# Patient Record
Sex: Female | Born: 1960 | Race: White | Hispanic: No | Smoking: Never smoker
Health system: Southern US, Community
[De-identification: ages and names within clinical notes are randomized; demographics above are authoritative.]

## PROBLEM LIST (undated history)

## (undated) DIAGNOSIS — K56609 Unspecified intestinal obstruction, unspecified as to partial versus complete obstruction: Secondary | ICD-10-CM

## (undated) HISTORY — PX: ABDOMINAL SURGERY: SHX537

---

## 2015-03-23 ENCOUNTER — Emergency Department (HOSPITAL_BASED_OUTPATIENT_CLINIC_OR_DEPARTMENT_OTHER): Payer: BLUE CROSS/BLUE SHIELD

## 2015-03-23 ENCOUNTER — Encounter (HOSPITAL_BASED_OUTPATIENT_CLINIC_OR_DEPARTMENT_OTHER): Payer: Self-pay | Admitting: *Deleted

## 2015-03-23 ENCOUNTER — Inpatient Hospital Stay (HOSPITAL_BASED_OUTPATIENT_CLINIC_OR_DEPARTMENT_OTHER)
Admission: EM | Admit: 2015-03-23 | Discharge: 2015-03-27 | DRG: 390 | Disposition: A | Discharge status: Home / Self Care | Payer: BLUE CROSS/BLUE SHIELD | Attending: General Surgery | Admitting: General Surgery

## 2015-03-23 DIAGNOSIS — K56600 Partial intestinal obstruction, unspecified as to cause: Secondary | ICD-10-CM

## 2015-03-23 DIAGNOSIS — R109 Unspecified abdominal pain: Secondary | ICD-10-CM | POA: Diagnosis present

## 2015-03-23 DIAGNOSIS — E039 Hypothyroidism, unspecified: Secondary | ICD-10-CM | POA: Diagnosis present

## 2015-03-23 DIAGNOSIS — Z79899 Other long term (current) drug therapy: Secondary | ICD-10-CM | POA: Diagnosis not present

## 2015-03-23 DIAGNOSIS — G43909 Migraine, unspecified, not intractable, without status migrainosus: Secondary | ICD-10-CM | POA: Diagnosis present

## 2015-03-23 DIAGNOSIS — K56609 Unspecified intestinal obstruction, unspecified as to partial versus complete obstruction: Secondary | ICD-10-CM | POA: Diagnosis present

## 2015-03-23 HISTORY — DX: Unspecified intestinal obstruction, unspecified as to partial versus complete obstruction: K56.609

## 2015-03-23 LAB — URINE MICROSCOPIC-ADD ON: RBC / HPF: NONE SEEN RBC/hpf (ref 0–5)

## 2015-03-23 LAB — CBC WITH DIFFERENTIAL/PLATELET
BASOS PCT: 0 %
Basophils Absolute: 0 10*3/uL (ref 0.0–0.1)
Eosinophils Absolute: 0 10*3/uL (ref 0.0–0.7)
Eosinophils Relative: 0 %
HEMATOCRIT: 42.1 % (ref 36.0–46.0)
HEMOGLOBIN: 14.8 g/dL (ref 12.0–15.0)
LYMPHS ABS: 1.5 10*3/uL (ref 0.7–4.0)
LYMPHS PCT: 15 %
MCH: 32.7 pg (ref 26.0–34.0)
MCHC: 35.2 g/dL (ref 30.0–36.0)
MCV: 93.1 fL (ref 78.0–100.0)
MONO ABS: 1 10*3/uL (ref 0.1–1.0)
MONOS PCT: 10 %
NEUTROS ABS: 7.3 10*3/uL (ref 1.7–7.7)
NEUTROS PCT: 75 %
Platelets: 239 10*3/uL (ref 150–400)
RBC: 4.52 MIL/uL (ref 3.87–5.11)
RDW: 12.3 % (ref 11.5–15.5)
WBC: 9.8 10*3/uL (ref 4.0–10.5)

## 2015-03-23 LAB — URINALYSIS, ROUTINE W REFLEX MICROSCOPIC
Bilirubin Urine: NEGATIVE
GLUCOSE, UA: NEGATIVE mg/dL
Hgb urine dipstick: NEGATIVE
KETONES UR: NEGATIVE mg/dL
NITRITE: NEGATIVE
PH: 7.5 (ref 5.0–8.0)
Protein, ur: NEGATIVE mg/dL
SPECIFIC GRAVITY, URINE: 1.021 (ref 1.005–1.030)

## 2015-03-23 LAB — COMPREHENSIVE METABOLIC PANEL
ALBUMIN: 4.4 g/dL (ref 3.5–5.0)
ALK PHOS: 67 U/L (ref 38–126)
ALT: 38 U/L (ref 14–54)
ANION GAP: 13 (ref 5–15)
AST: 29 U/L (ref 15–41)
BILIRUBIN TOTAL: 0.8 mg/dL (ref 0.3–1.2)
BUN: 17 mg/dL (ref 6–20)
CALCIUM: 9.6 mg/dL (ref 8.9–10.3)
CO2: 26 mmol/L (ref 22–32)
Chloride: 100 mmol/L — ABNORMAL LOW (ref 101–111)
Creatinine, Ser: 0.66 mg/dL (ref 0.44–1.00)
GFR calc Af Amer: >60 mL/min (ref >60)
GLUCOSE: 128 mg/dL — AB (ref 65–99)
POTASSIUM: 3.5 mmol/L (ref 3.5–5.1)
Sodium: 139 mmol/L (ref 135–145)
TOTAL PROTEIN: 8 g/dL (ref 6.5–8.1)

## 2015-03-23 LAB — LIPASE, BLOOD: LIPASE: 25 U/L (ref 11–51)

## 2015-03-23 MED ORDER — ONDANSETRON HCL 4 MG/2ML IJ SOLN
4.0000 mg | Freq: Once | INTRAMUSCULAR | Status: AC
  Administered 2015-03-23: 4 mg via INTRAVENOUS
  Filled 2015-03-23: qty 2

## 2015-03-23 MED ORDER — MORPHINE SULFATE (PF) 2 MG/ML IV SOLN
1.0000 mg | INTRAVENOUS | Status: DC | PRN
  Administered 2015-03-23 – 2015-03-25 (×11): 2 mg via INTRAVENOUS
  Filled 2015-03-23 (×12): qty 1

## 2015-03-23 MED ORDER — KCL IN DEXTROSE-NACL 20-5-0.9 MEQ/L-%-% IV SOLN
INTRAVENOUS | Status: DC
  Administered 2015-03-23 – 2015-03-26 (×6): via INTRAVENOUS
  Filled 2015-03-23 (×10): qty 1000

## 2015-03-23 MED ORDER — HEPARIN SODIUM (PORCINE) 5000 UNIT/ML IJ SOLN
5000.0000 [IU] | Freq: Three times a day (TID) | INTRAMUSCULAR | Status: DC
  Administered 2015-03-23 – 2015-03-26 (×9): 5000 [IU] via SUBCUTANEOUS
  Filled 2015-03-23 (×14): qty 1

## 2015-03-23 MED ORDER — ONDANSETRON HCL 4 MG/2ML IJ SOLN
4.0000 mg | Freq: Four times a day (QID) | INTRAMUSCULAR | Status: DC | PRN
  Administered 2015-03-23 – 2015-03-25 (×3): 4 mg via INTRAVENOUS
  Filled 2015-03-23 (×3): qty 2

## 2015-03-23 MED ORDER — IOHEXOL 300 MG/ML  SOLN
100.0000 mL | Freq: Once | INTRAMUSCULAR | Status: AC | PRN
  Administered 2015-03-23: 100 mL via INTRAVENOUS

## 2015-03-23 MED ORDER — SODIUM CHLORIDE 0.9 % IV BOLUS (SEPSIS)
1000.0000 mL | Freq: Once | INTRAVENOUS | Status: AC
  Administered 2015-03-23: 1000 mL via INTRAVENOUS

## 2015-03-23 MED ORDER — MORPHINE SULFATE (PF) 4 MG/ML IV SOLN
4.0000 mg | Freq: Once | INTRAVENOUS | Status: AC
  Administered 2015-03-23: 4 mg via INTRAVENOUS
  Filled 2015-03-23: qty 1

## 2015-03-23 MED ORDER — PANTOPRAZOLE SODIUM 40 MG IV SOLR
40.0000 mg | Freq: Every day | INTRAVENOUS | Status: DC
  Administered 2015-03-23 – 2015-03-26 (×4): 40 mg via INTRAVENOUS
  Filled 2015-03-23 (×5): qty 40

## 2015-03-23 MED ORDER — IOHEXOL 300 MG/ML  SOLN
25.0000 mL | Freq: Once | INTRAMUSCULAR | Status: AC | PRN
  Administered 2015-03-23: 25 mL via ORAL

## 2015-03-23 MED ORDER — ONDANSETRON 4 MG PO TBDP: 4.0000 mg | ORAL_TABLET | Freq: Four times a day (QID) | ORAL | Status: DC | PRN

## 2015-03-23 NOTE — ED Notes (Signed)
FIRST ATTEMPT TO CALL REPORT TO 1506-1.

## 2015-03-23 NOTE — ED Notes (Signed)
Bed: [id number] Expected date:  Expected time:  Means of arrival:  Comments[facility]

## 2015-03-23 NOTE — ED Notes (Signed)
[facility] bowel obstruction which was confirmed with CT. Pt has had a total of 1 liter of NS and 8 mg of zofran and 8 mg of morphine. The last dose of 4 mg of each was given at 1722. Pt has a history of previous obstructions. Pt's vitals are stable at this time

## 2015-03-23 NOTE — ED Notes (Signed)
Phone Hand Off Report Called to Consulting civil engineerCharge RN at [facility]

## 2015-03-23 NOTE — ED Notes (Signed)
PA-C in to see pt for cont c/o pain, nausea and to discuss dx

## 2015-03-23 NOTE — ED Notes (Signed)
Hx of SBO. Reports abdominal distention, N/V x 1 day.  Last bowel movement last night.

## 2015-03-23 NOTE — ED Notes (Signed)
Pt ambulated to restroom with daughter assisting.

## 2015-03-23 NOTE — ED Notes (Signed)
Remains on cont POX, safety measures in place, callbell within reach

## 2015-03-23 NOTE — ED Notes (Signed)
PT  REMAINS  NPO   

## 2015-03-23 NOTE — H&P (Signed)
Dave Mannes is an 55 y.o. female.   Chief Complaint: abdominal pain HPI:  The patient is a 55 year old white female who presents with several days of bloating and crampy abdominal pain. The pain seemed to worsen yesterday. She has had a few episodes of nausea and vomiting. She did have a bowel movement this morning. She has continued to pass some flatus. She has a history of bowel obstruction 2-1/2 years ago that required exploration in [place]. She had not had any abdominal surgery prior to that and it was not clear what caused her blockage at that time. She states that the pain is crampy and seems to come and go. She denies any fevers or chills.  Past Medical History  Diagnosis Date  . Small bowel obstruction [facility])     Past Surgical History  Procedure Laterality Date  . Abdominal surgery      History reviewed. No pertinent family history. Social History:  reports that she has never smoked. She does not have any smokeless tobacco history on file. She reports that she does not drink alcohol or use illicit drugs.  Allergies: No Known Allergies   (Not in a hospital admission)  Results for orders placed or performed during the hospital encounter of 03/23/15 (from the past 48 hour(s))  CBC with Differential     Status: None   Collection Time: 03/23/15  1:55 PM  Result Value Ref Range   WBC 9.8 4.0 - 10.5 K/uL   RBC 4.52 3.87 - 5.11 MIL/uL   Hemoglobin 14.8 12.0 - 15.0 g/dL   HCT 42.1 36.0 - 46.0 %   MCV 93.1 78.0 - 100.0 fL   MCH 32.7 26.0 - 34.0 pg   MCHC 35.2 30.0 - 36.0 g/dL   RDW 12.3 11.5 - 15.5 %   Platelets 239 150 - 400 K/uL   Neutrophils Relative % 75 %   Neutro Abs 7.3 1.7 - 7.7 K/uL   Lymphocytes Relative 15 %   Lymphs Abs 1.5 0.7 - 4.0 K/uL   Monocytes Relative 10 %   Monocytes Absolute 1.0 0.1 - 1.0 K/uL   Eosinophils Relative 0 %   Eosinophils Absolute 0.0 0.0 - 0.7 K/uL   Basophils Relative 0 %   Basophils Absolute 0.0 0.0 - 0.1 K/uL  Comprehensive  metabolic panel     Status: Abnormal   Collection Time: 03/23/15  1:55 PM  Result Value Ref Range   Sodium 139 135 - 145 mmol/L   Potassium 3.5 3.5 - 5.1 mmol/L   Chloride 100 (L) 101 - 111 mmol/L   CO2 26 22 - 32 mmol/L   Glucose, Bld 128 (H) 65 - 99 mg/dL   BUN 17 6 - 20 mg/dL   Creatinine, Ser 0.66 0.44 - 1.00 mg/dL   Calcium 9.6 8.9 - 10.3 mg/dL   Total Protein 8.0 6.5 - 8.1 g/dL   Albumin 4.4 3.5 - 5.0 g/dL   AST 29 15 - 41 U/L   ALT 38 14 - 54 U/L   Alkaline Phosphatase 67 38 - 126 U/L   Total Bilirubin 0.8 0.3 - 1.2 mg/dL   GFR calc non Af Amer >60 >60 mL/min   GFR calc Af Amer >60 >60 mL/min    Comment: (NOTE) The eGFR has been calculated using the CKD EPI equation. This calculation has not been validated in all clinical situations. eGFR's persistently <60 mL/min signify possible Chronic Kidney Disease.    Anion gap 13 5 - 15  Lipase, blood  Status: None   Collection Time: 03/23/15  1:55 PM  Result Value Ref Range   Lipase 25 11 - 51 U/L  Urinalysis, Routine w reflex microscopic     Status: Abnormal   Collection Time: 03/23/15  2:48 PM  Result Value Ref Range   Color, Urine YELLOW YELLOW   APPearance TURBID (A) CLEAR   Specific Gravity, Urine 1.021 1.005 - 1.030   pH 7.5 5.0 - 8.0   Glucose, UA NEGATIVE NEGATIVE mg/dL   Hgb urine dipstick NEGATIVE NEGATIVE   Bilirubin Urine NEGATIVE NEGATIVE   Ketones, ur NEGATIVE NEGATIVE mg/dL   Protein, ur NEGATIVE NEGATIVE mg/dL   Nitrite NEGATIVE NEGATIVE   Leukocytes, UA TRACE (A) NEGATIVE  Urine microscopic-add on     Status: Abnormal   Collection Time: 03/23/15  2:48 PM  Result Value Ref Range   Squamous Epithelial / LPF 0-5 (A) NONE SEEN   WBC, UA 0-5 0 - 5 WBC/hpf   RBC / HPF NONE SEEN 0 - 5 RBC/hpf   Bacteria, UA RARE (A) NONE SEEN   Urine-Other MUCOUS PRESENT     Comment: AMORPHOUS URATES/PHOSPHATES   Ct Abdomen Pelvis W Contrast  03/23/2015  CLINICAL DATA:  Abdominal pain, distension, nausea, history of  small bowel obstruction EXAM: CT ABDOMEN AND PELVIS WITH CONTRAST TECHNIQUE: Multidetector CT imaging of the abdomen and pelvis was performed using the standard protocol following bolus administration of intravenous contrast. CONTRAST:  17m OMNIPAQUE IOHEXOL 300 MG/ML SOLN, 253mOMNIPAQUE IOHEXOL 300 MG/ML SOLN COMPARISON:  None. FINDINGS: Lower chest:  Lung bases are clear. Hepatobiliary: Liver is within normal limits. No suspicious/enhancing hepatic lesions. Gallbladder is unremarkable. No intrahepatic or extrahepatic ductal dilatation. Pancreas: Within normal limits. Spleen: Within normal limits. Adrenals/Urinary Tract: Adrenal glands are within normal limits. Kidneys are within normal limits.  No hydronephrosis. Bladder is within normal limits. Stomach/Bowel: Stomach is within normal limits. Multiple dilated loops of small bowel in the lower mid abdomen with fecalization distally and narrowing/transition to decompressed loops of ileum (series 2/image 87), suggesting high-grade partial small bowel obstruction. Normal appendix (series 2/ image 53). Colon is not decompressed. Vascular/Lymphatic: No evidence of abdominal aortic aneurysm. No suspicious abdominopelvic lymphadenopathy. Reproductive: Uterus is within normal limits. Bilateral ovaries are within normal limits. Bilateral tubal ligation clips. Other: Trace mesenteric ascites in the a left mid abdomen (series 2/ image 36). Trace dependent pelvic ascites. No free air or pneumatosis. Musculoskeletal: Visualized osseous structures are within normal limits. IMPRESSION: Multiple dilated loops of small bowel in the central abdomen with transition in the lower pelvis, compatible with partial small bowel obstruction. Trace abdominopelvic ascites. No free air or pneumatosis. Electronically Signed   By: SrJulian Hy.D.   On: 03/23/2015 16:48    Review of Systems  Constitutional: Negative.   HENT: Negative.   Eyes: Negative.   Respiratory: Negative.    Cardiovascular: Negative.   Gastrointestinal: Positive for nausea, vomiting and abdominal pain.  Genitourinary: Negative.   Musculoskeletal: Negative.   Skin: Negative.   Neurological: Negative.   Endo/Heme/Allergies: Negative.   Psychiatric/Behavioral: Negative.     Blood pressure 115/77, pulse 87, temperature 98.7 F (37.1 C), temperature source Oral, resp. rate 16, weight 56.7 kg (125 lb), last menstrual period 03/23/2015, SpO2 97 %. Physical Exam  Constitutional: She is oriented to person, place, and time. She appears well-developed and well-nourished.  HENT:  Head: Normocephalic and atraumatic.  Eyes: Conjunctivae and EOM are normal. Pupils are equal, round, and reactive to light.  Neck:  Normal range of motion. Neck supple.  Cardiovascular: Normal rate, regular rhythm and normal heart sounds.   Respiratory: Effort normal and breath sounds normal.  GI: Soft.  There is mild distension and mild tenderness. No peritonitis. Few bs  Musculoskeletal: Normal range of motion.  Neurological: She is alert and oriented to person, place, and time.  Skin: Skin is warm and dry.  Psychiatric: She has a normal mood and affect. Her behavior is normal.     Assessment/Plan  The patient appears to have a partial small bowel obstruction. At this point I would recommend bowel rest and NG tube compression. We will monitor her closely and repeat her abdominal x-rays in the morning. If she does not seem to improve over the next 24-48 hours then she may require exploration.  Merrie Roof, MD 03/23/2015, 7:27 PM

## 2015-03-23 NOTE — ED Notes (Signed)
[facility] Team at bedside 

## 2015-03-23 NOTE — ED Notes (Signed)
Pt tolerated PO contrast well

## 2015-03-23 NOTE — ED Notes (Signed)
Phone Hand Off report given to [facility]

## 2015-03-23 NOTE — ED Provider Notes (Signed)
CSN: [id number]     Arrival date & time 03/23/15  1316 History   First MD Initiated Contact with Patient 03/23/15 1406     Chief Complaint  Patient presents with  . Abdominal Pain     (Consider location/radiation/quality/duration/timing/severity/associated sxs/prior Treatment) HPI   Patient is a 55 year old female with past medical history small bowel obstruction who presents the ED with complaint of abdominal pain, onset 2 days. Patient reports 2 days ago after eating dinner she began having diffuse abdominal pain. She notes she took a Tums which provided mild relief in her pain only lasted approximately 45 hours. Patient reports while eating dinner last night for abdominal pain returned. She reports having constant diffuse "pulling and fullness" pain to her abdomen. She notes the pain has worsened and this morning started waxing and waning. She notes she took Metamucil and Tums last night with no relief. She endorses having nausea and 4 episodes of NBNB vomiting since 3am today. Last bowel movement at 3 AM this morning which she notes was dark brown and more firm. Patient reports having a small bowel obstruction approximately 2-1/2 years ago requiring surgery and notes her pain feels similar. Denies fever, chills, HA, cough, SOB, CP, hematochezia, urinary sxs, vaginal bleeding, vaginal d/c. Pt reports she postmenopausal, LMP in 2012.    Past Medical History  Diagnosis Date  . Small bowel obstruction [facility])    Past Surgical History  Procedure Laterality Date  . Abdominal surgery     History reviewed. No pertinent family history. Social History  Substance Use Topics  . Smoking status: Never Smoker   . Smokeless tobacco: None  . Alcohol Use: No   OB History    No data available     Review of Systems  Gastrointestinal: Positive for nausea, vomiting and abdominal pain.  All other systems reviewed and are negative.     Allergies  Review of patient's allergies indicates no known  allergies.  Home Medications   Prior to Admission medications   Medication Sig Start Date End Date Taking? Authorizing Provider  DULoxetine (CYMBALTA) 30 MG capsule Take 30 mg by mouth daily.   Yes Historical Provider, MD  levothyroxine (SYNTHROID, LEVOTHROID) 25 MCG tablet Take 25 mcg by mouth daily before breakfast.   Yes Historical Provider, MD  omeprazole (PRILOSEC) 20 MG capsule Take 20 mg by mouth daily.   Yes Historical Provider, MD  rizatriptan (MAXALT-MLT) 5 MG disintegrating tablet Take 5 mg by mouth as needed for migraine. May repeat in 2 hours if needed   Yes Historical Provider, MD   BP 131/83 mmHg  Pulse 82  Temp(Src) 98.5 F (36.9 C) (Oral)  Resp 18  SpO2 98%  LMP 03/23/2015 Physical Exam  Constitutional: She is oriented to person, place, and time. She appears well-developed and well-nourished.  HENT:  Head: Normocephalic and atraumatic.  Mouth/Throat: Uvula is midline, oropharynx is clear and moist and mucous membranes are normal. No oropharyngeal exudate.  Eyes: Conjunctivae and EOM are normal. Pupils are equal, round, and reactive to light. Right eye exhibits no discharge. Left eye exhibits no discharge. No scleral icterus.  Neck: Normal range of motion. Neck supple.  Cardiovascular: Normal rate, regular rhythm, normal heart sounds and intact distal pulses.   Pulmonary/Chest: Effort normal and breath sounds normal. No respiratory distress. She has no wheezes. She has no rales. She exhibits no tenderness.  Abdominal: Soft. Bowel sounds are normal. She exhibits no distension and no mass. There is tenderness (diffuse abdominal tenderness). There  is no rebound and no guarding.  Musculoskeletal: Normal range of motion. She exhibits no edema.  Lymphadenopathy:    She has no cervical adenopathy.  Neurological: She is alert and oriented to person, place, and time.  Skin: Skin is warm and dry.  Nursing note and vitals reviewed.   ED Course  Procedures (including critical  care time) Labs Review Labs Reviewed  URINALYSIS, ROUTINE W REFLEX MICROSCOPIC (NOT [facility]) - Abnormal; Notable for the following:    APPearance TURBID (*)    Leukocytes, UA TRACE (*)    All other components within normal limits  COMPREHENSIVE METABOLIC PANEL - Abnormal; Notable for the following:    Chloride 100 (*)    Glucose, Bld 128 (*)    All other components within normal limits  URINE MICROSCOPIC-ADD ON - Abnormal; Notable for the following:    Squamous Epithelial / LPF 0-5 (*)    Bacteria, UA RARE (*)    All other components within normal limits  URINE CULTURE  CBC WITH DIFFERENTIAL/PLATELET  LIPASE, BLOOD    Imaging Review Ct Abdomen Pelvis W Contrast  03/23/2015  CLINICAL DATA:  Abdominal pain, distension, nausea, history of small bowel obstruction EXAM: CT ABDOMEN AND PELVIS WITH CONTRAST TECHNIQUE: Multidetector CT imaging of the abdomen and pelvis was performed using the standard protocol following bolus administration of intravenous contrast. CONTRAST:  100mL OMNIPAQUE IOHEXOL 300 MG/ML SOLN, 25mL OMNIPAQUE IOHEXOL 300 MG/ML SOLN COMPARISON:  None. FINDINGS: Lower chest:  Lung bases are clear. Hepatobiliary: Liver is within normal limits. No suspicious/enhancing hepatic lesions. Gallbladder is unremarkable. No intrahepatic or extrahepatic ductal dilatation. Pancreas: Within normal limits. Spleen: Within normal limits. Adrenals/Urinary Tract: Adrenal glands are within normal limits. Kidneys are within normal limits.  No hydronephrosis. Bladder is within normal limits. Stomach/Bowel: Stomach is within normal limits. Multiple dilated loops of small bowel in the lower mid abdomen with fecalization distally and narrowing/transition to decompressed loops of ileum (series 2/image 87), suggesting high-grade partial small bowel obstruction. Normal appendix (series 2/ image 53). Colon is not decompressed. Vascular/Lymphatic: No evidence of abdominal aortic aneurysm. No suspicious  abdominopelvic lymphadenopathy. Reproductive: Uterus is within normal limits. Bilateral ovaries are within normal limits. Bilateral tubal ligation clips. Other: Trace mesenteric ascites in the a left mid abdomen (series 2/ image 36). Trace dependent pelvic ascites. No free air or pneumatosis. Musculoskeletal: Visualized osseous structures are within normal limits. IMPRESSION: Multiple dilated loops of small bowel in the central abdomen with transition in the lower pelvis, compatible with partial small bowel obstruction. Trace abdominopelvic ascites. No free air or pneumatosis. Electronically Signed   By: Charline BillsSriyesh  Krishnan M.D.   On: 03/23/2015 16:48   I have personally reviewed and evaluated these images and lab results as part of my medical decision-making.   EKG Interpretation None      MDM   Final diagnoses:  Partial small bowel obstruction (HCC)    Patient presents with abdominal pain, nausea and vomiting. History of small bowel obstruction. VSS. Exam revealed diffuse abdominal tenderness, no peritoneal signs. Patient given IV fluids, Zofran and morphine. Labs and urine unremarkable. CT abdomen revealed partial small bowel obstruction. On reevaluation patient reports her pain and nausea have returned. Patient given second dose of IV pain meds and antiemetics. Patient given another liter of IV fluids. Consulted surgery. Dr. Carolynne Edouardoth advised to transfer patient to [facility] in order for him to evaluate patient. I spoke with [facility] Fayrene FearingJames [facility] who agrees to accept transfer.  Discussed results and plan for transfer with patient.     Satira Sark [place], [place] 03/23/15 1725  Arby Barrette, MD 03/27/15 [phone]

## 2015-03-24 ENCOUNTER — Inpatient Hospital Stay (HOSPITAL_COMMUNITY): Payer: BLUE CROSS/BLUE SHIELD

## 2015-03-24 LAB — BASIC METABOLIC PANEL
Anion gap: 10 (ref 5–15)
BUN: 10 mg/dL (ref 6–20)
CHLORIDE: 108 mmol/L (ref 101–111)
CO2: 26 mmol/L (ref 22–32)
Calcium: 8.6 mg/dL — ABNORMAL LOW (ref 8.9–10.3)
Creatinine, Ser: 0.46 mg/dL (ref 0.44–1.00)
GFR calc Af Amer: >60 mL/min (ref >60)
GLUCOSE: 126 mg/dL — AB (ref 65–99)
Potassium: 3.6 mmol/L (ref 3.5–5.1)
Sodium: 144 mmol/L (ref 135–145)

## 2015-03-24 LAB — CBC
HEMATOCRIT: 35.7 % — AB (ref 36.0–46.0)
HEMOGLOBIN: 12 g/dL (ref 12.0–15.0)
MCH: 32.3 pg (ref 26.0–34.0)
MCHC: 33.6 g/dL (ref 30.0–36.0)
MCV: 96 fL (ref 78.0–100.0)
Platelets: 204 10*3/uL (ref 150–400)
RBC: 3.72 MIL/uL — ABNORMAL LOW (ref 3.87–5.11)
RDW: 13 % (ref 11.5–15.5)
WBC: 6.9 10*3/uL (ref 4.0–10.5)

## 2015-03-24 MED ORDER — LEVOTHYROXINE SODIUM 100 MCG IV SOLR
12.5000 ug | Freq: Every day | INTRAVENOUS | Status: DC
  Administered 2015-03-24 – 2015-03-27 (×4): 12.5 ug via INTRAVENOUS
  Filled 2015-03-24 (×4): qty 5

## 2015-03-24 MED ORDER — DIPHENHYDRAMINE HCL 50 MG/ML IJ SOLN
25.0000 mg | Freq: Once | INTRAMUSCULAR | Status: AC
Start: 2015-03-24 — End: 2015-03-24
  Administered 2015-03-24: 25 mg via INTRAVENOUS
  Filled 2015-03-24: qty 1

## 2015-03-24 NOTE — Progress Notes (Signed)
Subjective: She definitely feels better  Objective: Vital signs in last 24 hours: Temp:  [97.6 F (36.4 C)-98.7 F (37.1 C)] 97.6 F (36.4 C) (03/12 0528) Pulse Rate:  [82-92] 90 (03/12 0528) Resp:  [16-18] 18 (03/12 0528) BP: (110-131)/(70-91) 120/78 mmHg (03/12 0528) SpO2:  [95 %-99 %] 97 % (03/12 0528) Weight:  [56.7 kg (125 lb)-57.6 kg (126 lb 15.8 oz)] 57.6 kg (126 lb 15.8 oz) (03/11 1945) Last BM Date: 03/23/15  Intake/Output from previous day: 03/11 0701 - 03/12 0700 In: 1050 [I.V.:1000; NG/GT:50] Out: 800 [Emesis/NG output:800] Intake/Output this shift:    Resp: clear to auscultation bilaterally Cardio: regular rate and rhythm GI: soft, nontender. mild distension  Lab Results:   Recent Labs  03/23/15 1355 03/24/15 0630  WBC 9.8 6.9  HGB 14.8 12.0  HCT 42.1 35.7*  PLT 239 204   BMET  Recent Labs  03/23/15 1355 03/24/15 0630  NA 139 144  K 3.5 3.6  CL 100* 108  CO2 26 26  GLUCOSE 128* 126*  BUN 17 10  CREATININE 0.66 0.46  CALCIUM 9.6 8.6*   PT/INR No results for input(s): LABPROT, INR in the last 72 hours. ABG No results for input(s): PHART, HCO3 in the last 72 hours.  Invalid input(s): PCO2, PO2  Studies/Results: Ct Abdomen Pelvis W Contrast  03/23/2015  CLINICAL DATA:  Abdominal pain, distension, nausea, history of small bowel obstruction EXAM: CT ABDOMEN AND PELVIS WITH CONTRAST TECHNIQUE: Multidetector CT imaging of the abdomen and pelvis was performed using the standard protocol following bolus administration of intravenous contrast. CONTRAST:  OMNIPAQUE IOHEXOL 300 MG/ML SOLN, 25mL OMNIPAQUE IOHEXOL 300 MG/ML SOLN COMPARISON:  None. FINDINGS: Lower chest:  Lung bases are clear. Hepatobiliary: Liver is within normal limits. No suspicious/enhancing hepatic lesions. Gallbladder is unremarkable. No intrahepatic or extrahepatic ductal dilatation. Pancreas: Within normal limits. Spleen: Within normal limits. Adrenals/Urinary Tract:  Adrenal glands are within normal limits. Kidneys are within normal limits.  No hydronephrosis. Bladder is within normal limits. Stomach/Bowel: Stomach is within normal limits. Multiple dilated loops of small bowel in the lower mid abdomen with fecalization distally and narrowing/transition to decompressed loops of ileum (series 2/image 87), suggesting high-grade partial small bowel obstruction. Normal appendix (series 2/ image 53). Colon is not decompressed. Vascular/Lymphatic: No evidence of abdominal aortic aneurysm. No suspicious abdominopelvic lymphadenopathy. Reproductive: Uterus is within normal limits. Bilateral ovaries are within normal limits. Bilateral tubal ligation clips. Other: Trace mesenteric ascites in the a left mid abdomen (series 2/ image 36). Trace dependent pelvic ascites. No free air or pneumatosis. Musculoskeletal: Visualized osseous structures are within normal limits. IMPRESSION: Multiple dilated loops of small bowel in the central abdomen with transition in the lower pelvis, compatible with partial small bowel obstruction. Trace abdominopelvic ascites. No free air or pneumatosis. Electronically Signed   By: Charline Bills M.D.   On: 03/23/2015 16:48   Dg Abd Portable 2v  03/24/2015  CLINICAL DATA:  Small bowel obstruction EXAM: PORTABLE ABDOMEN - 2 VIEW COMPARISON:  CT abdomen pelvis dated 03/23/2015 FINDINGS: Enteric tube terminates in the gastric body. Mildly prominent loops of small bowel in the central abdomen. Contrast within the transverse colon extending to the splenic flexure. No evidence of free air on the lateral decubitus view. Excretory contrast in the bladder. IMPRESSION: Enteric tube terminates in the gastric body. Mildly prominent loops of small bowel in the central abdomen with residual contrast in the transverse colon. Given the CT appearance, this may reflect mild/resolving partial small bowel  obstruction. No free air. Electronically Signed   By: Charline BillsSriyesh  Krishnan  M.D.   On: 03/24/2015 07:56    Anti-infectives: Anti-infectives    None      Assessment/Plan: s/p * No surgery found * Continue ng and bowel rest  Ambulate Recheck abd xrays in am SBO improving  LOS: 1 day    TOTH III,PAUL S 03/24/2015

## 2015-03-25 ENCOUNTER — Inpatient Hospital Stay (HOSPITAL_COMMUNITY): Payer: BLUE CROSS/BLUE SHIELD

## 2015-03-25 LAB — URINE CULTURE

## 2015-03-25 MED ORDER — SUMATRIPTAN SUCCINATE 50 MG PO TABS
50.0000 mg | ORAL_TABLET | ORAL | Status: DC | PRN
  Administered 2015-03-25 – 2015-03-26 (×3): 50 mg via ORAL
  Filled 2015-03-25 (×5): qty 1

## 2015-03-25 MED ORDER — RIZATRIPTAN BENZOATE 5 MG PO TBDP: 5.0000 mg | ORAL_TABLET | ORAL | Status: DC | PRN

## 2015-03-25 MED ORDER — KETOROLAC TROMETHAMINE 15 MG/ML IJ SOLN
15.0000 mg | Freq: Once | INTRAMUSCULAR | Status: AC
  Administered 2015-03-25: 15 mg via INTRAVENOUS
  Filled 2015-03-25: qty 1

## 2015-03-25 NOTE — Progress Notes (Signed)
Subjective: Abdomen is better, she has a whole cannister full of fluid from the NG.  Less distended and feels better, but having a Migraine now.   Objective: Vital signs in last 24 hours: Temp:  [97.6 F (36.4 C)-98.4 F (36.9 C)] 98.3 F (36.8 C) (03/13 0413) Pulse Rate:  [69-90] 90 (03/13 0413) Resp:  [16-19] 16 (03/13 0413) BP: (113-124)/(67-78) 118/72 mmHg (03/13 0413) SpO2:  [98 %-100 %] 100 % (03/13 0413) Last BM Date: 03/22/15 (per pt) 625 from NG No BM NPO Afebrile, VSS Labs OK Film this Am shows contrast in transverse colon Intake/Output from previous day: 03/12 0701 - 03/13 0700 In: 2441.7 [I.V.:2431.7; NG/GT:10] Out: 625 [Emesis/NG output:625] Intake/Output this shift:    General appearance: alert, cooperative and no distress Resp: clear to auscultation bilaterally GI: soft, + BS, not very distended right now.      Lab Results:   Recent Labs  03/23/15 1355 03/24/15 0630  WBC 9.8 6.9  HGB 14.8 12.0  HCT 42.1 35.7*  PLT 239 204    BMET  Recent Labs  03/23/15 1355 03/24/15 0630  NA 139 144  K 3.5 3.6  CL 100* 108  CO2 26 26  GLUCOSE 128* 126*  BUN 17 10  CREATININE 0.66 0.46  CALCIUM 9.6 8.6*   PT/INR No results for input(s): LABPROT, INR in the last 72 hours.   Recent Labs Lab 03/23/15 1355  AST 29  ALT 38  ALKPHOS 67  BILITOT 0.8  PROT 8.0  ALBUMIN 4.4     Lipase     Component Value Date/Time   LIPASE 25 03/23/2015 1355     Studies/Results: Ct Abdomen Pelvis W Contrast  03/23/2015  CLINICAL DATA:  Abdominal pain, distension, nausea, history of small bowel obstruction EXAM: CT ABDOMEN AND PELVIS WITH CONTRAST TECHNIQUE: Multidetector CT imaging of the abdomen and pelvis was performed using the standard protocol following bolus administration of intravenous contrast. CONTRAST:  100mL OMNIPAQUE IOHEXOL 300 MG/ML SOLN, 25mL OMNIPAQUE IOHEXOL 300 MG/ML SOLN COMPARISON:  None. FINDINGS: Lower chest:  Lung bases are clear.  Hepatobiliary: Liver is within normal limits. No suspicious/enhancing hepatic lesions. Gallbladder is unremarkable. No intrahepatic or extrahepatic ductal dilatation. Pancreas: Within normal limits. Spleen: Within normal limits. Adrenals/Urinary Tract: Adrenal glands are within normal limits. Kidneys are within normal limits.  No hydronephrosis. Bladder is within normal limits. Stomach/Bowel: Stomach is within normal limits. Multiple dilated loops of small bowel in the lower mid abdomen with fecalization distally and narrowing/transition to decompressed loops of ileum (series 2/image 87), suggesting high-grade partial small bowel obstruction. Normal appendix (series 2/ image 53). Colon is not decompressed. Vascular/Lymphatic: No evidence of abdominal aortic aneurysm. No suspicious abdominopelvic lymphadenopathy. Reproductive: Uterus is within normal limits. Bilateral ovaries are within normal limits. Bilateral tubal ligation clips. Other: Trace mesenteric ascites in the a left mid abdomen (series 2/ image 36). Trace dependent pelvic ascites. No free air or pneumatosis. Musculoskeletal: Visualized osseous structures are within normal limits. IMPRESSION: Multiple dilated loops of small bowel in the central abdomen with transition in the lower pelvis, compatible with partial small bowel obstruction. Trace abdominopelvic ascites. No free air or pneumatosis. Electronically Signed   By: Charline BillsSriyesh  Krishnan M.D.   On: 03/23/2015 16:48   Dg Abd 2 Views  03/25/2015  CLINICAL DATA:  Followup small bowel obstruction EXAM: ABDOMEN - 2 VIEW COMPARISON:  03/24/2015 FINDINGS: Nasogastric tube is seen with tip in proximal stomach. Oral contrast is now seen within the ascending and transverse portions  of the colon. There is no evidence of dilated small bowel loops. No evidence of free air. Surgical clips noted from previous bilateral tubal ligation. IMPRESSION: Oral contrast seen predominantly in the transverse colon. No evidence  of small bowel dilatation. Electronically Signed   By: Myles Rosenthal M.D.   On: 03/25/2015 08:44   Dg Abd Portable 2v  03/24/2015  CLINICAL DATA:  Small bowel obstruction EXAM: PORTABLE ABDOMEN - 2 VIEW COMPARISON:  CT abdomen pelvis dated 03/23/2015 FINDINGS: Enteric tube terminates in the gastric body. Mildly prominent loops of small bowel in the central abdomen. Contrast within the transverse colon extending to the splenic flexure. No evidence of free air on the lateral decubitus view. Excretory contrast in the bladder. IMPRESSION: Enteric tube terminates in the gastric body. Mildly prominent loops of small bowel in the central abdomen with residual contrast in the transverse colon. Given the CT appearance, this may reflect mild/resolving partial small bowel obstruction. No free air. Electronically Signed   By: Charline Bills M.D.   On: 03/24/2015 07:56    Medications: . heparin  5,000 Units Subcutaneous 3 times per day  . levothyroxine  12.5 mcg Intravenous Daily  . pantoprazole (PROTONIX) IV  40 mg Intravenous QHS    Assessment/Plan SBO Prior SBO with surgery 2014 in [place] Migraine - she has one now. Hypothyroid Antibiotics:  none DVT:  Heparin/SCD  Plan:  NG clamping, she hasn't been OOB because of her migraine, I will try clamping trials and see if we can fix her Migraine,  See how she does with clamping, walk when she is over Migraine.    LOS: 2 days    Cathi Hazan 03/25/2015

## 2015-03-25 NOTE — Progress Notes (Signed)
Pt with c/o of h/a. Has exhausted daily dose of imitrex. MD made aware. New order given. VWilliams,rn.

## 2015-03-26 MED ORDER — DIPHENHYDRAMINE HCL 50 MG/ML IJ SOLN
INTRAMUSCULAR | Status: AC
  Filled 2015-03-26: qty 1

## 2015-03-26 MED ORDER — DIPHENHYDRAMINE HCL 50 MG/ML IJ SOLN
25.0000 mg | Freq: Once | INTRAMUSCULAR | Status: AC
  Administered 2015-03-26: 25 mg via INTRAVENOUS

## 2015-03-26 NOTE — Care Management Note (Signed)
Case Management Note  Patient Details  Name: Wanda Arellano MRN: [id number] Date of Birth: [birth year]  Subjective/Objective:     55 yo admitted with SBO               Action/Plan: From home with spouse  Expected Discharge Date:                  Expected Discharge Plan:  Home/Self Care  In-House Referral:     Discharge planning Services  CM Consult  Post Acute Care Choice:    Choice offered to:     DME Arranged:    DME Agency:     HH Arranged:    HH Agency:     Status of Service:  In process, will continue to follow  Medicare Important Message Given:    Date Medicare IM Given:    Medicare IM give by:    Date Additional Medicare IM Given:    Additional Medicare Important Message give by:     If discussed at Long Length of Stay Meetings, dates discussed:    Additional Comments: Chart reviewed and no CM needs identified or communicated at this time. CM will continue to follow. Sandford Crazeora Audre Cenci RN,BSN,NCM [phone] Bartholome BillCLEMENTS, Merilyn Pagan H, RN 03/26/2015, 12:07 PM

## 2015-03-26 NOTE — Progress Notes (Signed)
Pt requesting benadryl for sleep. Reports she takes same at home to help her sleep. MD made ware. Order given. VWilliams,rn.

## 2015-03-26 NOTE — Progress Notes (Cosign Needed)
  Subjective: Pt tolerating clamp of NG tube w/o nausea. +flatus, no BMs. Is anxious to get tube removed. Migraine is for the most part resolved. Plans to ambulate today.  Objective: Vital signs in last 24 hours: Temp:  [98.1 F (36.7 C)-99.2 F (37.3 C)] 98.1 F (36.7 C) (03/14 0641) Pulse Rate:  [68-77] 68 (03/14 0641) Resp:  [16-18] 16 (03/14 0641) BP: (113-141)/(67-85) 113/67 mmHg (03/14 0641) SpO2:  [97 %-99 %] 97 % (03/14 0641) Last BM Date: 03/22/15 (per pt)  NG clamped Abd plain films show contrast in colon (03/25/15)  Intake/Output from previous day: 03/13 0701 - 03/14 0700 In: 1345 [I.V.:1325; NG/GT:20] Out: 125 [Emesis/NG output:125] Intake/Output this shift:    Gen: alert, pleasant, WDWN, NAD Card: RRR with normal S1, S2. No M/R/G noted.  Pulm: CTAB, no wheezes, rhonchi, or rales noted.  Respiratory effort nonlabored Abd: soft, mild tenderness, slightly distended, few BS, no masses, hernias, or organomegaly   Lab Results:   Recent Labs  03/23/15 1355 03/24/15 0630  WBC 9.8 6.9  HGB 14.8 12.0  HCT 42.1 35.7*  PLT 239 204   BMET  Recent Labs  03/23/15 1355 03/24/15 0630  NA 139 144  K 3.5 3.6  CL 100* 108  CO2 26 26  GLUCOSE 128* 126*  BUN 17 10  CREATININE 0.66 0.46  CALCIUM 9.6 8.6*   PT/INR No results for input(s): LABPROT, INR in the last 72 hours. ABG No results for input(s): PHART, HCO3 in the last 72 hours.  Invalid input(s): PCO2, PO2  Studies/Results: Dg Abd 2 Views  03/25/2015  CLINICAL DATA:  Followup small bowel obstruction EXAM: ABDOMEN - 2 VIEW COMPARISON:  03/24/2015 FINDINGS: Nasogastric tube is seen with tip in proximal stomach. Oral contrast is now seen within the ascending and transverse portions of the colon. There is no evidence of dilated small bowel loops. No evidence of free air. Surgical clips noted from previous bilateral tubal ligation. IMPRESSION: Oral contrast seen predominantly in the transverse colon. No  evidence of small bowel dilatation. Electronically Signed   By: Myles RosenthalJohn  Stahl M.D.   On: 03/25/2015 08:44    Anti-infectives: Anti-infectives    None      Assessment/Plan: SBO 1. Trial of NG clamping tolerated, pull NG tube and begin clear diet 2. Follow to evaluate advances in diet 3. Ambulate and IS as tolerated  [place] Migraines Hypothyroid  Antibiotics: none DVT proph: Heparin/SCDs     LOS: 3 days    Renee PainStephanie Milferd Ansell, PA-S [facility] 03/26/2015

## 2015-03-27 MED ORDER — DIPHENHYDRAMINE HCL 50 MG PO CAPS
50.0000 mg | ORAL_CAPSULE | Freq: Once | ORAL | Status: AC
  Administered 2015-03-27: 50 mg via ORAL
  Filled 2015-03-27: qty 1

## 2015-03-27 NOTE — Discharge Instructions (Signed)
Small Bowel Obstruction °A small bowel obstruction is a blockage in the small bowel. The small bowel, which is also called the small intestine, is a long, slender tube that connects the stomach to the colon. When a person eats and drinks, food and fluids go from the stomach to the small bowel. This is where most of the nutrients in the food and fluids are absorbed. °A small bowel obstruction will prevent food and fluids from passing through the small bowel as they normally do during digestion. The small bowel can become partially or completely blocked. This can cause symptoms such as abdominal pain, vomiting, and bloating. If this condition is not treated, it can be dangerous because the small bowel could rupture. °CAUSES °Common causes of this condition include: °· Scar tissue from previous surgery or radiation treatment. °· Recent surgery. This may cause the movements of the bowel to slow down and cause food to block the intestine. °· Hernias. °· Inflammatory bowel disease (colitis). °· Twisting of the bowel (volvulus). °· Tumors. °· A foreign body. °· Slipping of a part of the bowel into another part (intussusception). °SYMPTOMS °Symptoms of this condition include: °· Abdominal pain. This may be dull cramps or sharp pain. It may occur in one area, or it may be present in the entire abdomen. Pain can range from mild to severe, depending on the degree of obstruction. °· Nausea and vomiting. Vomit may be greenish or a yellow bile color. °· Abdominal bloating. °· Constipation. °· Lack of passing gas. °· Frequent belching. °· Diarrhea. This may occur if the obstruction is partial and runny stool is able to leak around the obstruction. °DIAGNOSIS °This condition may be diagnosed based on a physical exam, medical history, and X-rays of the abdomen. You may also have other tests, such as a CT scan of the abdomen and pelvis. °TREATMENT °Treatment for this condition depends on the cause and severity of the problem.  Treatment options may include: °· Bed rest along with fluids and pain medicines that are given through an IV tube inserted into one of your veins. Sometimes, this is all that is needed for the obstruction to improve. °· Following a simple diet. In some cases, a clear liquid diet may be required for several days. This allows the bowel to rest. °· Placement of a small tube (nasogastric tube) into the stomach. When the bowel is blocked, it usually swells up like a balloon that is filled with air and fluids. The air and fluids may be removed by suction through the nasogastric tube. This can help with pain, discomfort, and nausea. It can also help the obstruction to clear up faster. °· Surgery. This may be required if other treatments do not work. Bowel obstruction from a hernia may require early surgery and can be an emergency procedure. Surgery may also be required for scar tissue that causes frequent or severe obstructions. °HOME CARE INSTRUCTIONS °· Get plenty of rest. °· Follow instructions from your health care provider about eating restrictions. You may need to avoid solid foods and consume only clear liquids until your condition improves. °· Take over-the-counter and prescription medicines only as told by your health care provider. °· Keep all follow-up visits as told by your health care provider. This is important. °SEEK MEDICAL CARE IF: °· You have a fever. °· You have chills. °SEEK IMMEDIATE MEDICAL CARE IF: °· You have increased pain or cramping. °· You vomit blood. °· You have uncontrolled vomiting or nausea. °· You cannot drink   fluids because of vomiting or pain.  You develop confusion.  You begin feeling very dry or thirsty (dehydrated).  You have severe bloating.  You feel extremely weak or you faint.   This information is not intended to replace advice given to you by your health care provider. Make sure you discuss any questions you have with your health care provider.   Document Released:  03/17/2005 Document Revised: 09/19/2014 Document Reviewed: 02/22/2014 Elsevier Interactive Patient Education 2016 [employer]  Low-Fiber Diet for 1 week then go back to high fiber diet Fiber is found in fruits, vegetables, and whole grains. A low-fiber diet restricts fibrous foods that are not digested in the small intestine. A diet containing about 10-15 grams of fiber per day is considered low fiber. Low-fiber diets may be used to:  Promote healing and rest the bowel during intestinal flare-ups.  Prevent blockage of a partially obstructed or narrowed gastrointestinal tract.  Reduce fecal weight and volume.  Slow the movement of feces. You may be on a low-fiber diet as a transitional diet following surgery, after an injury (trauma), or because of a short (acute) or lifelong (chronic) illness. Your health care provider will determine the length of time you need to stay on this diet.  WHAT DO I NEED TO KNOW ABOUT A LOW-FIBER DIET? Always check the fiber content on the packaging's Nutrition Facts label, especially on foods from the grains list. Ask your dietitian if you have questions about specific foods that are related to your condition, especially if the food is not listed below. In general, a low-fiber food will have less than 2 g of fiber. WHAT FOODS CAN I EAT? Grains All breads and crackers made with white flour. Sweet rolls, doughnuts, waffles, pancakes, [place] toast, bagels. Pretzels, Melba toast, zwieback. Well-cooked cereals, such as cornmeal, farina, or cream cereals. Dry cereals that do not contain whole grains, fruit, or nuts, such as refined corn, wheat, rice, and oat cereals. Potatoes prepared any way without skins, plain pastas and noodles, refined white rice. Use white flour for baking and making sauces. Use allowed list of grains for casseroles, dumplings, and puddings.  Vegetables Strained tomato and vegetable juices. Fresh lettuce, cucumber, spinach. Well-cooked (no skin or  pulp) or canned vegetables, such as asparagus, bean sprouts, beets, carrots, green beans, mushrooms, potatoes, pumpkin, spinach, yellow squash, tomato sauce/puree, turnips, yams, and zucchini. Keep servings limited to  cup.  Fruits All fruit juices except prune juice. Cooked or canned fruits without skin and seeds, such as applesauce, apricots, cherries, fruit cocktail, grapefruit, grapes, mandarin oranges, melons, peaches, pears, pineapple, and plums. Fresh fruits without skin, such as apricots, avocados, bananas, melons, pineapple, nectarines, and peaches. Keep servings limited to  cup or 1 piece.  Meat and Other Protein Sources Ground or well-cooked tender beef, ham, veal, lamb, pork, or poultry. Eggs, plain cheese. Fish, oysters, shrimp, lobster, and other seafood. Liver, organ meats. Smooth nut butters. Dairy All milk products and alternative dairy substitutes, such as soy, rice, almond, and coconut, not containing added whole nuts, seeds, or added fruit. Beverages Decaf coffee, fruit, and vegetable juices or smoothies (small amounts, with no pulp or skins, and with fruits from allowed list), sports drinks, herbal tea. Condiments Ketchup, mustard, vinegar, cream sauce, cheese sauce, cocoa powder. Spices in moderation, such as allspice, basil, bay leaves, celery powder or leaves, cinnamon, cumin powder, curry powder, ginger, mace, marjoram, onion or garlic powder, oregano, paprika, parsley flakes, ground pepper, rosemary, sage, savory, tarragon, thyme, and  turmeric. Sweets and Desserts Plain cakes and cookies, pie made with allowed fruit, pudding, custard, cream pie. Gelatin, fruit, ice, sherbet, frozen ice pops. Ice cream, ice milk without nuts. Plain hard candy, honey, jelly, molasses, syrup, sugar, chocolate syrup, gumdrops, marshmallows. Limit overall sugar intake.  Fats and Oil Margarine, butter, cream, mayonnaise, salad oils, plain salad dressings made from allowed foods. Choose healthy  fats such as olive oil, canola oil, and omega-3 fatty acids (such as found in salmon or tuna) when possible.  Other Bouillon, broth, or cream soups made from allowed foods. Any strained soup. Casseroles or mixed dishes made with allowed foods. The items listed above may not be a complete list of recommended foods or beverages. Contact your dietitian for more options.  WHAT FOODS ARE NOT RECOMMENDED? Grains All whole wheat and whole grain breads and crackers. Multigrains, rye, bran seeds, nuts, or coconut. Cereals containing whole grains, multigrains, bran, coconut, nuts, raisins. Cooked or dry oatmeal, steel-cut oats. Coarse wheat cereals, granola. Cereals advertised as high fiber. Potato skins. Whole grain pasta, wild or brown rice. Popcorn. Coconut flour. Bran, buckwheat, corn bread, multigrains, rye, wheat germ.  Vegetables Fresh, cooked or canned vegetables, such as artichokes, asparagus, beet greens, broccoli, Brussels sprouts, cabbage, celery, cauliflower, corn, eggplant, kale, legumes or beans, okra, peas, and tomatoes. Avoid large servings of any vegetables, especially raw vegetables.  Fruits Fresh fruits, such as apples with or without skin, berries, cherries, figs, grapes, grapefruit, guavas, kiwis, mangoes, oranges, papayas, pears, persimmons, pineapple, and pomegranate. Prune juice and juices with pulp, stewed or dried prunes. Dried fruits, dates, raisins. Fruit seeds or skins. Avoid large servings of all fresh fruits. Meats and Other Protein Sources Tough, fibrous meats with gristle. Chunky nut butter. Cheese made with seeds, nuts, or other foods not recommended. Nuts, seeds, legumes (beans, including baked beans), dried peas, beans, lentils.  Dairy Yogurt or cheese that contains nuts, seeds, or added fruit.  Beverages Fruit juices with high pulp, prune juice. Caffeinated coffee and teas.  Condiments Coconut, maple syrup, pickles, olives. Sweets and Desserts Desserts, cookies, or  candies that contain nuts or coconut, chunky peanut butter, dried fruits. Jams, preserves with seeds, marmalade. Large amounts of sugar and sweets. Any other dessert made with fruits from the not recommended list.  Other Soups made from vegetables that are not recommended or that contain other foods not recommended.  The items listed above may not be a complete list of foods and beverages to avoid. Contact your dietitian for more information.   This information is not intended to replace advice given to you by your health care provider. Make sure you discuss any questions you have with your health care provider.   Document Released: 06/20/2001 Document Revised: 01/03/2013 Document Reviewed: 11/21/2012 Elsevier Interactive Patient Education 2016 Elsevier Inc.  High-Fiber Diet Fiber, also called dietary fiber, is a type of carbohydrate found in fruits, vegetables, whole grains, and beans. A high-fiber diet can have many health benefits. Your health care provider may recommend a high-fiber diet to help:  Prevent constipation. Fiber can make your bowel movements more regular.  Lower your cholesterol.  Relieve hemorrhoids, uncomplicated diverticulosis, or irritable bowel syndrome.  Prevent overeating as part of a weight-loss plan.  Prevent heart disease, type 2 diabetes, and certain cancers. WHAT IS MY PLAN? The recommended daily intake of fiber includes:  38 grams for men under age 37.  30 grams for men over age 18.  25 grams for women under age 43.  43  grams for women over age 78. You can get the recommended daily intake of dietary fiber by eating a variety of fruits, vegetables, grains, and beans. Your health care provider may also recommend a fiber supplement if it is not possible to get enough fiber through your diet. WHAT DO I NEED TO KNOW ABOUT A HIGH-FIBER DIET?  Fiber supplements have not been widely studied for their effectiveness, so it is better to get fiber through food  sources.  Always check the fiber content on thenutrition facts label of any prepackaged food. Look for foods that contain at least 5 grams of fiber per serving.  Ask your dietitian if you have questions about specific foods that are related to your condition, especially if those foods are not listed in the following section.  Increase your daily fiber consumption gradually. Increasing your intake of dietary fiber too quickly may cause bloating, cramping, or gas.  Drink plenty of water. Water helps you to digest fiber. WHAT FOODS CAN I EAT? Grains Whole-grain breads. Multigrain cereal. Oats and oatmeal. Brown rice. Barley. Bulgur wheat. Millet. Bran muffins. Popcorn. Rye wafer crackers. Vegetables Sweet potatoes. Spinach. Kale. Artichokes. Cabbage. Broccoli. Green peas. Carrots. Squash. Fruits Berries. Pears. Apples. Oranges. Avocados. Prunes and raisins. Dried figs. Meats and Other Protein Sources Navy, kidney, pinto, and soy beans. Split peas. Lentils. Nuts and seeds. Dairy Fiber-fortified yogurt. Beverages Fiber-fortified soy milk. Fiber-fortified orange juice. Other Fiber bars. The items listed above may not be a complete list of recommended foods or beverages. Contact your dietitian for more options. WHAT FOODS ARE NOT RECOMMENDED? Grains White bread. Pasta made with refined flour. White rice. Vegetables Fried potatoes. Canned vegetables. Well-cooked vegetables.  Fruits Fruit juice. Cooked, strained fruit. Meats and Other Protein Sources Fatty cuts of meat. Fried Environmental education officer or fried fish. Dairy Milk. Yogurt. Cream cheese. Sour cream. Beverages Soft drinks. Other Cakes and pastries. Butter and oils. The items listed above may not be a complete list of foods and beverages to avoid. Contact your dietitian for more information. WHAT ARE SOME TIPS FOR INCLUDING HIGH-FIBER FOODS IN MY DIET?  Eat a wide variety of high-fiber foods.  Make sure that half of all grains consumed  each day are whole grains.  Replace breads and cereals made from refined flour or white flour with whole-grain breads and cereals.  Replace white rice with brown rice, bulgur wheat, or millet.  Start the day with a breakfast that is high in fiber, such as a cereal that contains at least 5 grams of fiber per serving.  Use beans in place of meat in soups, salads, or pasta.  Eat high-fiber snacks, such as berries, raw vegetables, nuts, or popcorn.   This information is not intended to replace advice given to you by your health care provider. Make sure you discuss any questions you have with your health care provider.   Document Released: 12/29/2004 Document Revised: 01/19/2014 Document Reviewed: 06/13/2013 Elsevier Interactive Patient Education [employer]

## 2015-03-27 NOTE — Discharge Summary (Signed)
Physician Discharge Summary  Patient ID: Wanda LarsenMichele Arellano MRN: [id number] DOB/AGE: 56/01/1960 56 y.o. PCP:  Charlies SilversJENNIFER COUILLARD PAC Admit date: 03/23/2015 Discharge date: 03/27/2015  Admission Diagnoses:  Prior SBO with surgery 2014 in [place] Migraine -  Hypothyroid  Discharge Diagnoses:  Same   Active Problems:   SBO (small bowel obstruction) (HCC)   PROCEDURES: None  Hospital Course:  The patient is a 55 year old white female who presents with several days of bloating and crampy abdominal pain. The pain seemed to worsen yesterday. She has had a few episodes of nausea and vomiting. She did have a bowel movement this morning. She has continued to pass some flatus. She has a history of bowel obstruction 2-1/2 years ago that required exploration in [place]. She had not had any abdominal surgery prior to that and it was not clear what caused her blockage at that time. She states that the pain is crampy and seems to come and go. She denies any fevers or chills. Pt was admitted and NG was placed.  She was placed on bowel rest, NG decompression, and hydration.   Follow up films the 1st day showed she had contrast in her colon.  She also had a migraine headache.  We started her on clamping trials and treated her migraine.  The next day we pulled the NG and started her on clears.  She has advanced to a soft diet and has had a couple bowel movements.  We are letting her go home today.  She can follow up with her PCP and get back on high fiber regime in about 1 week.  She is on 8 supplements, 6 different drugs and I suggested she sit down and talk with her PCP about all of these.   She can call our office for issues if she likes, but if she obstructs again she needs to come to the ED.  CBC Latest Ref Rng 03/24/2015 03/23/2015  WBC 4.0 - 10.5 K/uL 6.9 9.8  Hemoglobin 12.0 - 15.0 g/dL 56.212.0 13.014.8  Hematocrit 86.536.0 - 46.0 % 35.7(L) 42.1  Platelets 150 - 400 K/uL 204 239    CMP Latest Ref Rng  03/24/2015 03/23/2015  Glucose 65 - 99 mg/dL 784(O126(H) 962(X128(H)  BUN 6 - 20 mg/dL 10 17  Creatinine 5.280.44 - 1.00 mg/dL 4.130.46 2.440.66  Sodium 010135 - 145 mmol/L 144 139  Potassium 3.5 - 5.1 mmol/L 3.6 3.5  Chloride 101 - 111 mmol/L 108 100(L)  CO2 22 - 32 mmol/L 26 26  Calcium 8.9 - 10.3 mg/dL 2.7(O8.6(L) 9.6  Total Protein 6.5 - 8.1 g/dL - 8.0  Total Bilirubin 0.3 - 1.2 mg/dL - 0.8  Alkaline Phos 38 - 126 U/L - 67  AST 15 - 41 U/L - 29  ALT 14 - 54 U/L - 38     Condition on d/c:  Improved   Disposition: 01-Home or Self Care     Medication List    TAKE these medications        calcium carbonate 1250 (500 Ca) MG chewable tablet  Commonly known as:  OS-CAL  Chew 1 tablet by mouth daily.     calcium carbonate 500 MG chewable tablet  Commonly known as:  TUMS - dosed in mg elemental calcium  Chew 1 tablet by mouth 3 (three) times daily as needed for indigestion or heartburn.     diphenhydrAMINE 25 MG tablet  Commonly known as:  BENADRYL  Take 25 mg by mouth at bedtime.     DULoxetine  30 MG capsule  Commonly known as:  CYMBALTA  Take 30 mg by mouth daily.     Fish Oil 1000 MG Caps  Take 1,000 mg by mouth daily.     folic acid 400 MCG tablet  Commonly known as:  FOLVITE  Take 400 mcg by mouth daily.     glucosamine-chondroitin 500-400 MG tablet  Take 1 tablet by mouth daily.     levothyroxine 25 MCG tablet  Commonly known as:  SYNTHROID, LEVOTHROID  Take 25 mcg by mouth daily before breakfast.     Lysine 500 MG Tabs  Take 500 mg by mouth 2 (two) times daily.     magnesium oxide 400 MG tablet  Commonly known as:  MAG-OX  Take 400 mg by mouth daily.     multivitamin with minerals Tabs tablet  Take 1 tablet by mouth daily.     naproxen sodium 220 MG tablet  Commonly known as:  ANAPROX  Take 220 mg by mouth 2 (two) times daily as needed (pain).     omeprazole 20 MG capsule  Commonly known as:  PRILOSEC  Take 20 mg by mouth daily.     rizatriptan 5 MG disintegrating tablet   Commonly known as:  MAXALT-MLT  Take 5 mg by mouth as needed for migraine. May repeat in 2 hours if needed     sodium chloride 0.65 % Soln nasal spray  Commonly known as:  OCEAN  Place 1 spray into both nostrils 2 (two) times daily as needed for congestion.       Follow-up Information    Follow up with Call you primary care doctor for follow up.      Follow up with [facility].   Specialty:  General Surgery   Why:  if you have problems you can call the office, but if you obstruct again you need to return to the Emergency department.   Contact information:   [address] [place] [address] [phone]       Follow up with Couillard, Lise Auer, PA-C.   Specialty:  Physician Assistant   Why:  Call for follow up    Contact information:   [address] [place] [address] [phone]       Signed: Sherrie George 03/27/2015, 2:58 PM

## 2015-03-27 NOTE — Progress Notes (Signed)
  Subjective: She had a small BM x 2 yesterday, tolerating full liquids.    Objective: Vital signs in last 24 hours: Temp:  [98.3 F (36.8 C)-98.9 F (37.2 C)] 98.3 F (36.8 C) (03/15 0451) Pulse Rate:  [63-97] 63 (03/15 0451) Resp:  [16-18] 16 (03/15 0451) BP: (100-125)/(50-77) 100/50 mmHg (03/15 0451) SpO2:  [98 %-99 %] 98 % (03/15 0451) Last BM Date: 03/26/15 480 PO  Full liquids Urine x 2 Afebrile, VSS No labs Intake/Output from previous day: 03/14 0701 - 03/15 0700 In: 480 [P.O.:480] Out: -  Intake/Output this shift:    General appearance: alert and cooperative, no distress GI: soft, non-tender; bowel sounds normal; no masses,  no organomegaly  Lab Results:  No results for input(s): WBC, HGB, HCT, PLT in the last 72 hours.  BMET No results for input(s): NA, K, CL, CO2, GLUCOSE, BUN, CREATININE, CALCIUM in the last 72 hours. PT/INR No results for input(s): LABPROT, INR in the last 72 hours.   Recent Labs Lab 03/23/15 1355  AST 29  ALT 38  ALKPHOS 67  BILITOT 0.8  PROT 8.0  ALBUMIN 4.4     Lipase     Component Value Date/Time   LIPASE 25 03/23/2015 1355     Studies/Results: Dg Abd 2 Views  03/25/2015  CLINICAL DATA:  Followup small bowel obstruction EXAM: ABDOMEN - 2 VIEW COMPARISON:  03/24/2015 FINDINGS: Nasogastric tube is seen with tip in proximal stomach. Oral contrast is now seen within the ascending and transverse portions of the colon. There is no evidence of dilated small bowel loops. No evidence of free air. Surgical clips noted from previous bilateral tubal ligation. IMPRESSION: Oral contrast seen predominantly in the transverse colon. No evidence of small bowel dilatation. Electronically Signed   By: Myles RosenthalJohn  Stahl M.D.   On: 03/25/2015 08:44    Medications: . heparin  5,000 Units Subcutaneous 3 times per day  . levothyroxine  12.5 mcg Intravenous Daily  . pantoprazole (PROTONIX) IV  40 mg Intravenous QHS    Assessment/Plan Prior SBO with  surgery 2014 in [place] -  Hypothyroid Antibiotics: none DVT: Heparin/SCD   Plan:  Soft diet, saline lock IV, allow her to ambulate more and if continues to do well home after lunch.   Doing well, she is on allot of supplements, and I recommended she go over these with her PCP who's name we cannot determine, so I have told her to go to them for follow up and let then access her records via EP[facility].     LOS: 4 days    Maicol Bowland 03/27/2015

## 2015-08-12 ENCOUNTER — Encounter (HOSPITAL_COMMUNITY): Payer: Self-pay | Admitting: Clinical

## 2015-08-12 ENCOUNTER — Encounter (INDEPENDENT_AMBULATORY_CARE_PROVIDER_SITE_OTHER): Payer: Self-pay

## 2015-08-12 ENCOUNTER — Ambulatory Visit (INDEPENDENT_AMBULATORY_CARE_PROVIDER_SITE_OTHER): Payer: BLUE CROSS/BLUE SHIELD | Admitting: Clinical

## 2015-08-12 DIAGNOSIS — F331 Major depressive disorder, recurrent, moderate: Secondary | ICD-10-CM | POA: Diagnosis not present

## 2015-08-12 DIAGNOSIS — F4310 Post-traumatic stress disorder, unspecified: Secondary | ICD-10-CM

## 2015-08-12 DIAGNOSIS — F411 Generalized anxiety disorder: Secondary | ICD-10-CM | POA: Diagnosis not present

## 2015-08-14 ENCOUNTER — Encounter (HOSPITAL_COMMUNITY): Payer: Self-pay | Admitting: Clinical
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
## 2015-08-21 ENCOUNTER — Ambulatory Visit (INDEPENDENT_AMBULATORY_CARE_PROVIDER_SITE_OTHER): Payer: BLUE CROSS/BLUE SHIELD | Admitting: Clinical

## 2015-08-21 DIAGNOSIS — F4310 Post-traumatic stress disorder, unspecified: Secondary | ICD-10-CM | POA: Diagnosis not present

## 2015-08-21 DIAGNOSIS — F331 Major depressive disorder, recurrent, moderate: Secondary | ICD-10-CM

## 2015-08-21 DIAGNOSIS — F411 Generalized anxiety disorder: Secondary | ICD-10-CM

## 2015-08-21 NOTE — Progress Notes (Addendum)
   THERAPIST PROGRESS NOTE  Session Time: 8:10  - 11:05  Participation Level: Active  Behavioral Response: CasualAlertDepressed  Type of Therapy: Individual Therapy  Treatment Goals addressed: Improve psychiatric symptoms, improve unhelpful thought patterns, reduce irrational worries and fears,  elevate mood (improved sleep, decrease irritability), learn about diagnosis, implement  healthy coping skills  Interventions: CBT and Motivational Interviewing, psycho education, grounding and mindfulness techniques,  Summary: Wanda Arellano is a 55 y.o. female who presents with Major depressive disorder, recurrent episode, moderate and PTSD, generalized anxiety disorder  Suicidal/Homicidal: No without intent/plan  Therapist Response: Selinda Eon met with clinician for an individual session. Wanda Arellano shared about her psychiatric symptoms, current life events and her goals for therapy. She shared that she continues to experience depression and anxiety. She shared about some of her life stressors which mainly involve family. Clinician introduced some basic cbt concepts. Client and clinician discussed the thought emotion connection. Clinician gave Wanda Arellano a homework packet Depression 1 & 2. Client and clinician discussed instructions for completing the packet. Wanda Arellano agreed to complete it and bring it back with her to next session. Clinician also introduced grounding and mindfulness. Clinician discussed the purpose, process, and practice of the techniques. Client and clinician practiced two techniques together. (mindfulness 4-7 -8 breathing, and grounding 10x3 categories. Wanda Arellano agreed to practice the techniques daily until next session. Client and clinician discussed techniques to improve sleep.   Plan: Return again in 1-2 weeks.  Diagnosis: Axis I: Major depressive disorder, recurrent episode, moderate and PTSD, generalized anxiety disorder   Eutimio Gharibian A, LCSW 08/21/2015

## 2015-08-28 ENCOUNTER — Encounter (HOSPITAL_COMMUNITY): Payer: Self-pay | Admitting: Clinical

## 2015-09-19 ENCOUNTER — Ambulatory Visit (INDEPENDENT_AMBULATORY_CARE_PROVIDER_SITE_OTHER): Payer: BLUE CROSS/BLUE SHIELD | Admitting: Clinical

## 2015-09-19 DIAGNOSIS — F4310 Post-traumatic stress disorder, unspecified: Secondary | ICD-10-CM

## 2015-09-19 DIAGNOSIS — F411 Generalized anxiety disorder: Secondary | ICD-10-CM

## 2015-09-19 DIAGNOSIS — F331 Major depressive disorder, recurrent, moderate: Secondary | ICD-10-CM | POA: Diagnosis not present

## 2015-09-19 NOTE — Progress Notes (Signed)
   THERAPIST PROGRESS NOTE  Session Time: 10:05   Participation Level: Active  Behavioral Response: CasualAlertDepressed  Type of Therapy: Individual Therapy  Treatment Goals addressed: Improve psychiatric symptoms,  improve unhelpful thought patterns,interpersonal relationship skills (decrease co-dependence, healthy boundaries, communicate more effectively, elevate mood ( decrease irritability), learn about diagnosis, implement  healthy coping skills  Interventions: CBT and Motivational Interviewing, psycho education, grounding and mindfulness techniques, CPT  Summary: Wanda Arellano is a 55 y.o. female who presents with Major depressive disorder, recurrent episode, moderate and PTSD, generalized anxiety disorder  Suicidal/Homicidal: No without intent/plan  Therapist Response: Wanda Arellano met with clinician for an individual session. Wanda Arellano shared about her psychiatric symptoms, current life events and her goals for therapy. She shared she has been had ups and downs with her depression. She shared that she had completed her homework depression packet 2. Client and clinician discussed her homework. Client and clinician discussed engaging in pleasurable activities. Wanda Arellano shared her thoughts and insights a bout this. Wanda Arellano shared a bout going to [place] to see her sister and family. She shared a bout positive aspects of the trip and negative aspects of the trip. Wanda Arellano shared some of her negative automatic thoughts. Client and clinician discussed the evidence for and against the thoughts. Wanda Arellano was then able to formulate healthier alternative thoughts. Wanda Arellano had the insight and bout expectations and perceived expectations of others. Client and clinician discussed her insights. Clinician gave her packet 3 for homework and  she agreed to continue practicing her grounding and mindfulness techniques.   Plan: Return again in 1-2 weeks.  Diagnosis: Axis I: Major depressive disorder, recurrent  episode, moderate and PTSD, generalized anxiety disorder    Mariaeduarda Defranco A, LCSW 09/19/2015

## 2015-09-25 ENCOUNTER — Encounter (HOSPITAL_COMMUNITY): Payer: Self-pay | Admitting: Clinical

## 2015-10-03 ENCOUNTER — Ambulatory Visit (INDEPENDENT_AMBULATORY_CARE_PROVIDER_SITE_OTHER): Payer: BLUE CROSS/BLUE SHIELD | Admitting: Clinical

## 2015-10-03 DIAGNOSIS — F411 Generalized anxiety disorder: Secondary | ICD-10-CM | POA: Diagnosis not present

## 2015-10-03 DIAGNOSIS — F4310 Post-traumatic stress disorder, unspecified: Secondary | ICD-10-CM

## 2015-10-03 DIAGNOSIS — F331 Major depressive disorder, recurrent, moderate: Secondary | ICD-10-CM
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
## 2015-10-03 NOTE — Progress Notes (Signed)
Cer

## 2015-10-10 ENCOUNTER — Ambulatory Visit (INDEPENDENT_AMBULATORY_CARE_PROVIDER_SITE_OTHER): Payer: BLUE CROSS/BLUE SHIELD | Admitting: Clinical

## 2015-10-10 ENCOUNTER — Encounter (HOSPITAL_COMMUNITY): Payer: Self-pay | Admitting: Clinical

## 2015-10-10 DIAGNOSIS — F331 Major depressive disorder, recurrent, moderate: Secondary | ICD-10-CM

## 2015-10-10 DIAGNOSIS — F411 Generalized anxiety disorder: Secondary | ICD-10-CM

## 2015-10-10 DIAGNOSIS — F4310 Post-traumatic stress disorder, unspecified: Secondary | ICD-10-CM

## 2015-10-10 NOTE — Progress Notes (Signed)
   THERAPIST PROGRESS NOTE  Session Time: 10:00 - 10:56  Participation Level: Active  Behavioral Response: CasualAlertAnxious  Type of Therapy: Individual Therapy  Treatment Goals addressed: Improve psychiatric symptoms, improve unhelpful thought patterns, reduce irrational worries and fears,  elevate mood (decrease irritability), learn about diagnosis, implement  healthy coping skills  Interventions: CBT and Motivational Interviewing, psycho education,   Summary: Wanda Arellano  is a 55 y.o. female who presents with Major depressive disorder, recurrent episode, moderate and PTSD, generalized anxiety disorder  Suicidal/Homicidal: No without intent/plan  Therapist Response: Selinda Eon met with clinician for an individual session. Courtny shared about her psychiatric symptoms, current life events and her homework. She shared that he had a rough past week. She shared that her daughter was in a car accident and there have been stresses in taking care of her mother. Client and clinician discussed the thoughts that she was having that increased her anxiety. Malachy Mood recognize that she was catastrophize having. Client and clinician discussed how to use her skills to decrease her catastrophic thoughts. Jesiah did complete her homework packet which was #4 of depression. Client and clinician discussed and reviewed her homework packet which was on the ABC of CBT. Client and clinician discussed how she could use it to address her current negative thoughts. Client and clinician discussed how our thoughts affect our emotions and actions. Selinda Eon and clinician used one of her catastrophic thoughts as an example. Clinician asked open ended questions and Lelani identified the evidence for and against the thoughts. Mardene Celeste was then able to formulate healthier alternative thoughts. Clinician gave Chevy packet 5 of depression which she agreed to do and bring back with her to next session. Client and clinician  discussed using exercise such as walking to help with her psychiatric symptoms.  Plan: Return again in 1-2 weeks.  Diagnosis: Axis I: Major depressive disorder, recurrent episode, moderate and PTSD, generalized anxiety disorder   Osama Coleson A, LCSW 10/10/2015

## 2015-10-21 ENCOUNTER — Encounter (HOSPITAL_COMMUNITY): Payer: Self-pay | Admitting: Clinical

## 2015-10-23 ENCOUNTER — Ambulatory Visit (HOSPITAL_COMMUNITY): Payer: Self-pay | Admitting: Clinical

## 2015-10-30 ENCOUNTER — Ambulatory Visit (HOSPITAL_COMMUNITY): Payer: Self-pay | Admitting: Clinical

## 2016-12-24 ENCOUNTER — Other Ambulatory Visit: Payer: Self-pay | Admitting: Physician Assistant

## 2016-12-24 ENCOUNTER — Ambulatory Visit
Admission: RE | Admit: 2016-12-24 | Discharge: 2016-12-24 | Disposition: A | Discharge status: Home / Self Care | Payer: BLUE CROSS/BLUE SHIELD | Source: Ambulatory Visit | Attending: Physician Assistant | Admitting: Physician Assistant

## 2016-12-24 DIAGNOSIS — R1033 Periumbilical pain: Secondary | ICD-10-CM

## 2016-12-24 DIAGNOSIS — R1012 Left upper quadrant pain: Secondary | ICD-10-CM

## 2016-12-24 DIAGNOSIS — R1084 Generalized abdominal pain: Secondary | ICD-10-CM

## 2016-12-24 DIAGNOSIS — R1013 Epigastric pain: Secondary | ICD-10-CM

## 2016-12-24 MED ORDER — IOPAMIDOL (ISOVUE-300) INJECTION 61%
100.0000 mL | Freq: Once | INTRAVENOUS | Status: AC | PRN
  Administered 2016-12-24: 100 mL via INTRAVENOUS

## 2016-12-25 ENCOUNTER — Other Ambulatory Visit: Payer: Self-pay | Admitting: Physician Assistant

## 2016-12-25 DIAGNOSIS — R1084 Generalized abdominal pain: Secondary | ICD-10-CM

## 2016-12-25 DIAGNOSIS — R1033 Periumbilical pain: Secondary | ICD-10-CM

## 2016-12-25 DIAGNOSIS — R1013 Epigastric pain: Secondary | ICD-10-CM

## 2016-12-25 DIAGNOSIS — R1012 Left upper quadrant pain: Secondary | ICD-10-CM

## 2017-11-05 ENCOUNTER — Other Ambulatory Visit (HOSPITAL_BASED_OUTPATIENT_CLINIC_OR_DEPARTMENT_OTHER): Payer: Self-pay | Admitting: Physician Assistant

## 2017-11-05 ENCOUNTER — Ambulatory Visit (HOSPITAL_BASED_OUTPATIENT_CLINIC_OR_DEPARTMENT_OTHER)
Admission: RE | Admit: 2017-11-05 | Discharge: 2017-11-05 | Disposition: A | Discharge status: Home / Self Care | Payer: BLUE CROSS/BLUE SHIELD | Source: Ambulatory Visit | Attending: Internal Medicine | Admitting: Internal Medicine

## 2017-11-05 ENCOUNTER — Other Ambulatory Visit (HOSPITAL_BASED_OUTPATIENT_CLINIC_OR_DEPARTMENT_OTHER): Payer: Self-pay | Admitting: Internal Medicine

## 2017-11-05 DIAGNOSIS — R1084 Generalized abdominal pain: Secondary | ICD-10-CM

## 2017-11-05 DIAGNOSIS — R1033 Periumbilical pain: Secondary | ICD-10-CM

## 2017-11-05 DIAGNOSIS — R1013 Epigastric pain: Secondary | ICD-10-CM

## 2017-11-05 DIAGNOSIS — R1012 Left upper quadrant pain: Secondary | ICD-10-CM

## 2017-11-08 ENCOUNTER — Encounter (HOSPITAL_BASED_OUTPATIENT_CLINIC_OR_DEPARTMENT_OTHER): Payer: Self-pay

## 2017-11-08 ENCOUNTER — Ambulatory Visit (HOSPITAL_BASED_OUTPATIENT_CLINIC_OR_DEPARTMENT_OTHER)
Admission: RE | Admit: 2017-11-08 | Discharge: 2017-11-08 | Disposition: A | Discharge status: Home / Self Care | Payer: BLUE CROSS/BLUE SHIELD | Source: Ambulatory Visit | Attending: Internal Medicine | Admitting: Internal Medicine

## 2017-11-08 ENCOUNTER — Other Ambulatory Visit (HOSPITAL_BASED_OUTPATIENT_CLINIC_OR_DEPARTMENT_OTHER): Payer: Self-pay | Admitting: Internal Medicine

## 2017-11-08 DIAGNOSIS — R1033 Periumbilical pain: Secondary | ICD-10-CM | POA: Diagnosis present

## 2017-11-08 DIAGNOSIS — R1012 Left upper quadrant pain: Secondary | ICD-10-CM

## 2017-11-08 DIAGNOSIS — R1084 Generalized abdominal pain: Secondary | ICD-10-CM | POA: Diagnosis present

## 2017-11-08 DIAGNOSIS — K760 Fatty (change of) liver, not elsewhere classified: Secondary | ICD-10-CM | POA: Diagnosis not present

## 2017-11-08 DIAGNOSIS — R1013 Epigastric pain: Secondary | ICD-10-CM | POA: Diagnosis present

## 2017-11-08 DIAGNOSIS — I700 Atherosclerosis of aorta: Secondary | ICD-10-CM | POA: Diagnosis not present

## 2017-11-08 DIAGNOSIS — Z1231 Encounter for screening mammogram for malignant neoplasm of breast: Secondary | ICD-10-CM

## 2017-11-08 MED ORDER — IOPAMIDOL (ISOVUE-300) INJECTION 61%
100.0000 mL | Freq: Once | INTRAVENOUS | Status: AC | PRN
  Administered 2017-11-08: 100 mL via INTRAVENOUS

## 2017-11-10 ENCOUNTER — Ambulatory Visit (HOSPITAL_BASED_OUTPATIENT_CLINIC_OR_DEPARTMENT_OTHER)
Admission: RE | Admit: 2017-11-10 | Discharge: 2017-11-10 | Disposition: A | Discharge status: Home / Self Care | Payer: BLUE CROSS/BLUE SHIELD | Source: Ambulatory Visit | Attending: Internal Medicine | Admitting: Internal Medicine

## 2017-11-10 ENCOUNTER — Encounter (INDEPENDENT_AMBULATORY_CARE_PROVIDER_SITE_OTHER): Payer: Self-pay

## 2017-11-10 DIAGNOSIS — Z1231 Encounter for screening mammogram for malignant neoplasm of breast: Secondary | ICD-10-CM | POA: Insufficient documentation

## 2017-12-22 ENCOUNTER — Other Ambulatory Visit: Payer: Self-pay | Admitting: Gastroenterology

## 2017-12-22 DIAGNOSIS — R1011 Right upper quadrant pain: Secondary | ICD-10-CM

## 2018-01-10 ENCOUNTER — Ambulatory Visit (HOSPITAL_COMMUNITY)
Admission: RE | Admit: 2018-01-10 | Discharge: 2018-01-10 | Disposition: A | Discharge status: Home / Self Care | Payer: BLUE CROSS/BLUE SHIELD | Source: Ambulatory Visit | Attending: Gastroenterology | Admitting: Gastroenterology

## 2018-01-10 ENCOUNTER — Encounter (HOSPITAL_COMMUNITY): Admission: RE | Admit: 2018-01-10 | Payer: BLUE CROSS/BLUE SHIELD | Source: Ambulatory Visit

## 2018-01-10 DIAGNOSIS — R1011 Right upper quadrant pain: Secondary | ICD-10-CM | POA: Diagnosis not present

## 2019-02-15 IMAGING — US US ABDOMEN LIMITED
1 series · 14 of 25 positions shown · non-contrast
Comparison: CT abdomen 11/08/2017.

CLINICAL DATA: RIGHT upper quadrant pain.  Symptoms for 8 months.

EXAM:
ULTRASOUND ABDOMEN LIMITED RIGHT UPPER QUADRANT

[Series 1: us abdomen limited · 0.20mm/px · 14 of 40 slices shown]
[im 1/40]
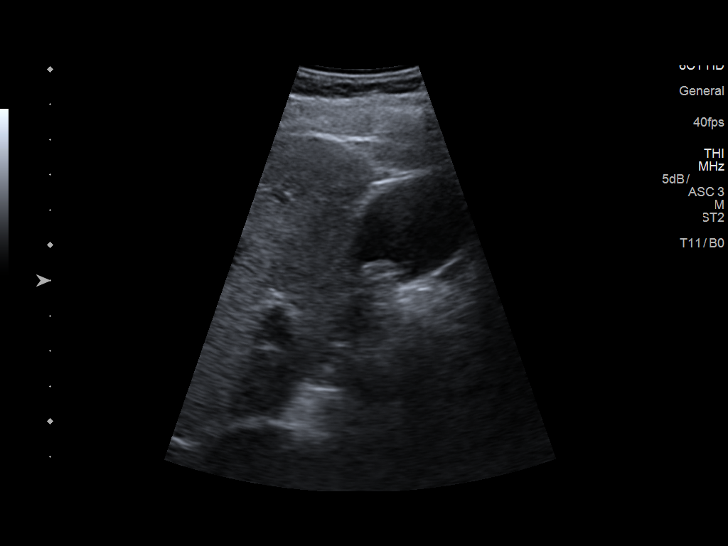
[im 4/40]
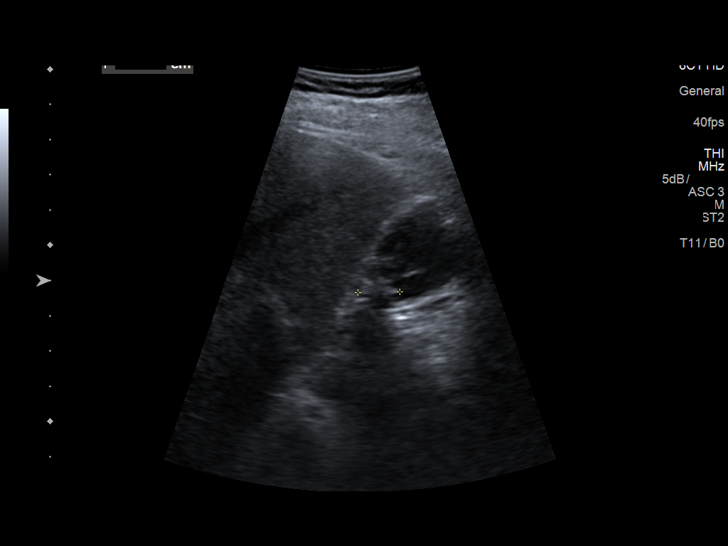
[im 7/40]
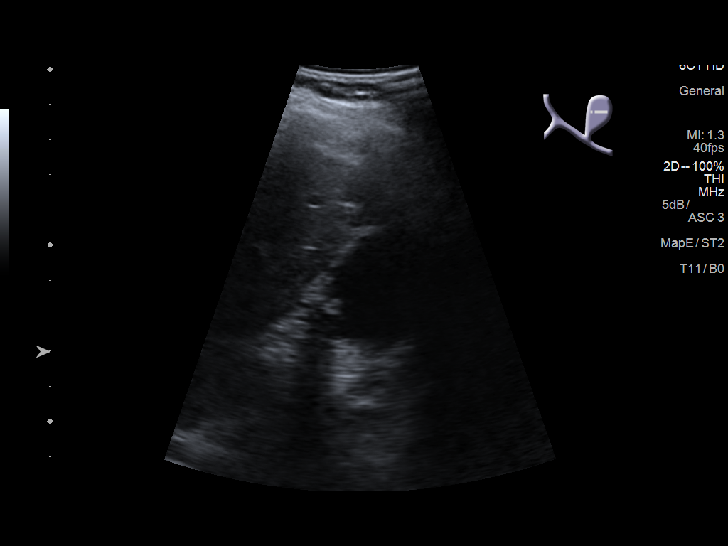
[im 10/40]
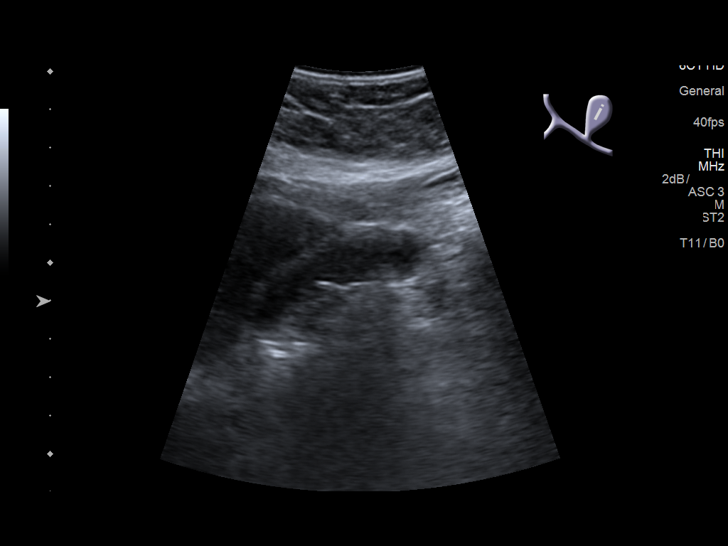
[im 14/40]
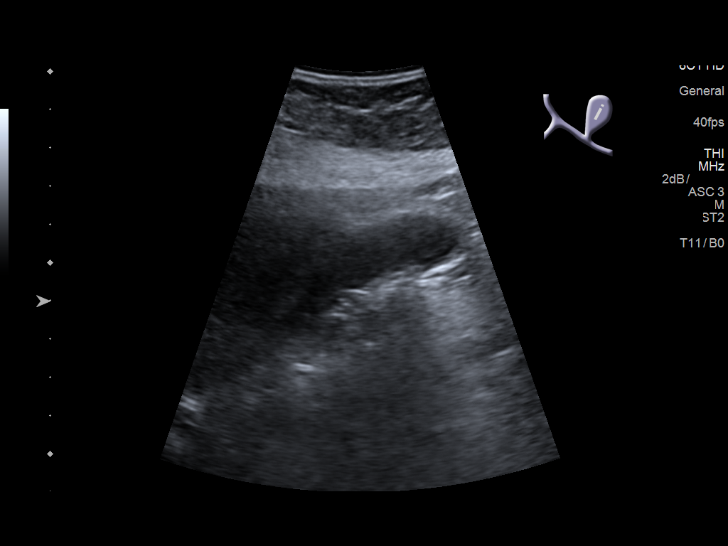
[im 15/40]
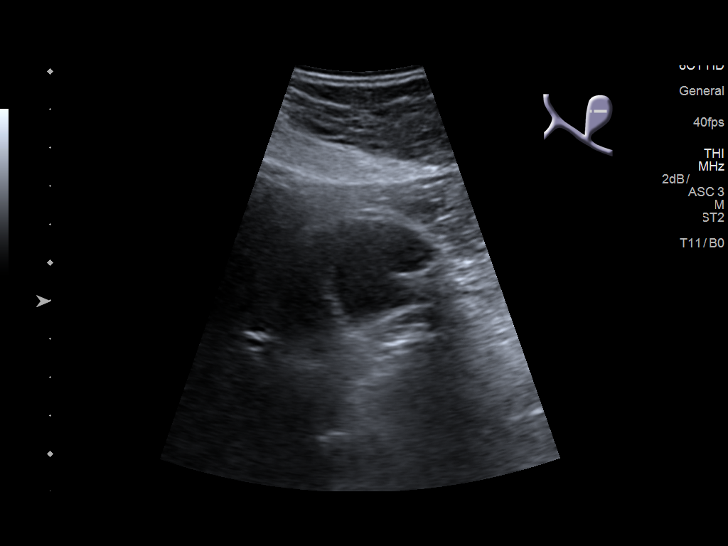
[im 18/40]
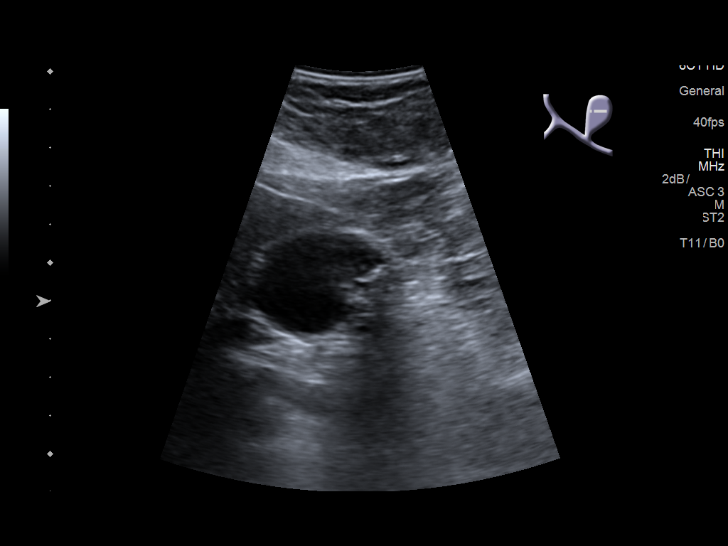
[im 22/40]
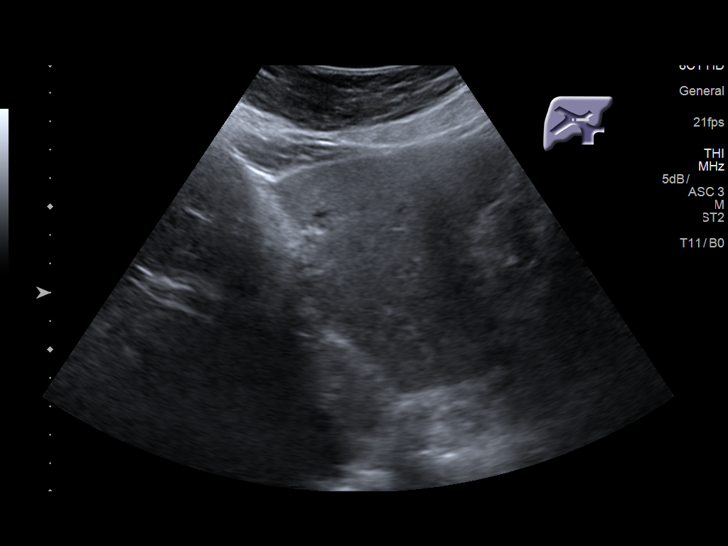
[im 25/40]
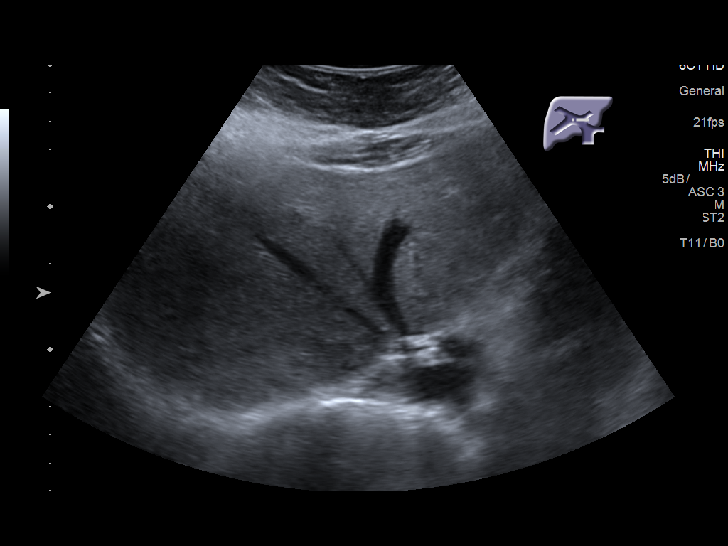
[im 27/40]
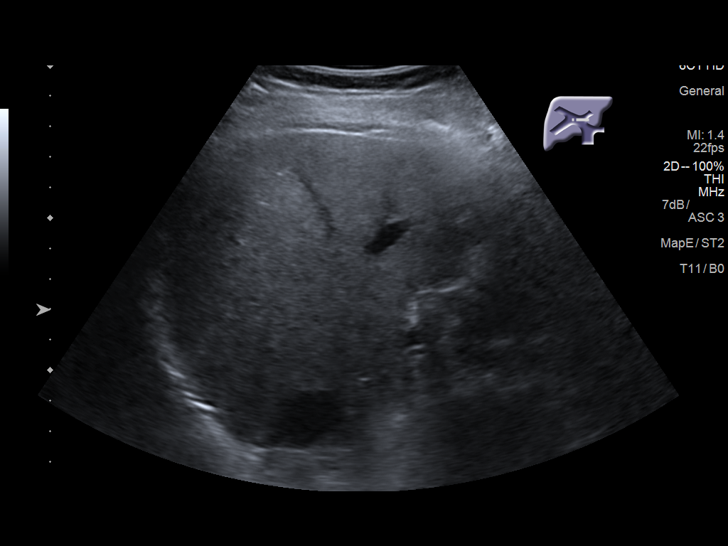
[im 30/40]
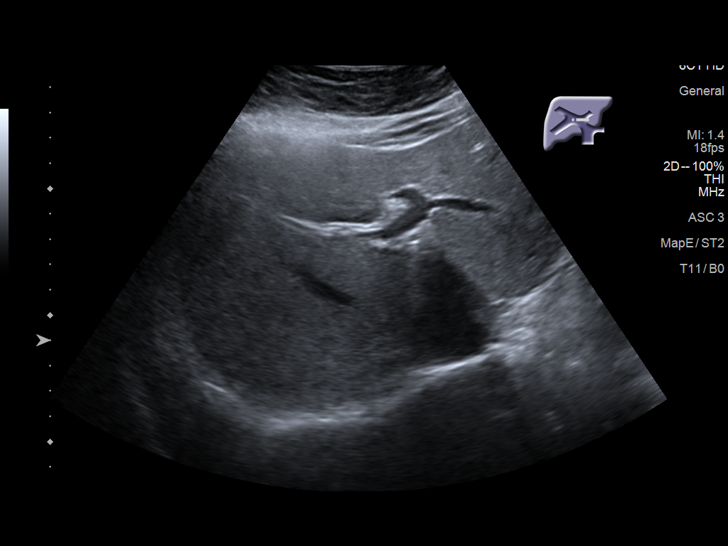
[im 33/40]
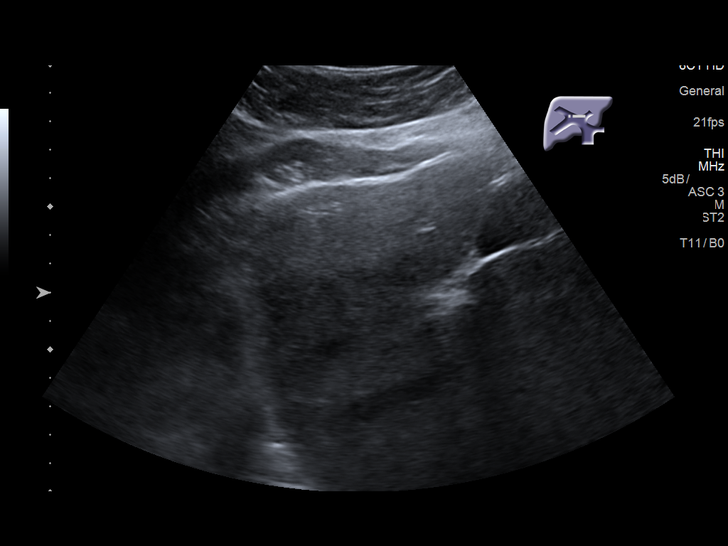
[im 36/40]
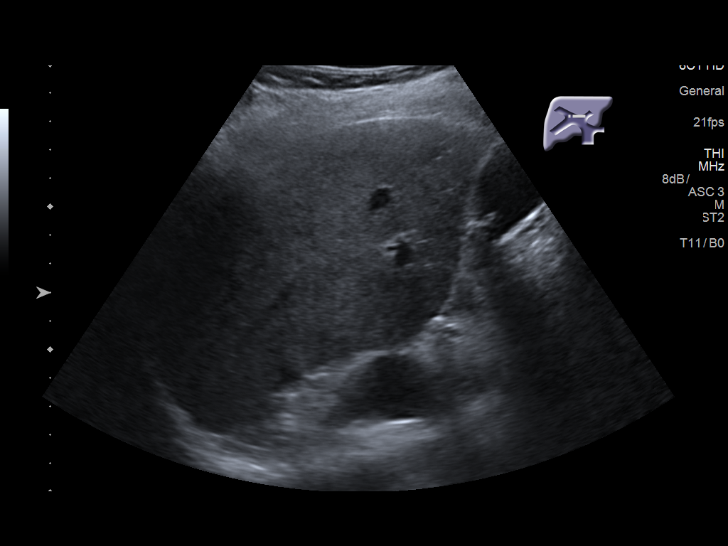
[im 40/40]
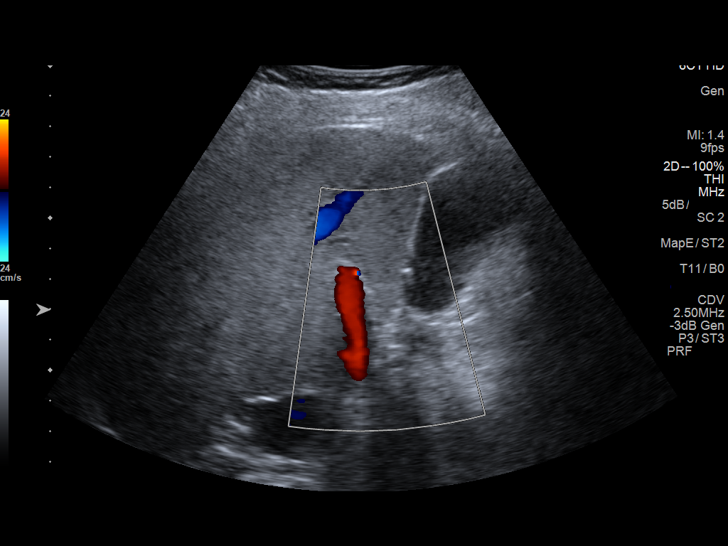

[14 of 25 positions shown; findings below may reference images not displayed]

FINDINGS: Gallbladder:

Multiple gallstones are present. Borderline gallbladder wall
thickening 2.2 mm. Negative sonographic Murphy's sign. Largest
single dimension calculus measures 12 mm.

Common bile duct:

Diameter: 3.5 mm.

Liver:

No focal lesion identified. Within normal limits in parenchymal
echogenicity. Portal vein is patent on color Doppler imaging with
normal direction of blood flow towards the liver.

Calculi are not clearly visible on recent CT.
IMPRESSION: Cholelithiasis without features of acute cholecystitis.

## 2019-03-01 IMAGING — MG DIGITAL SCREENING BILATERAL MAMMOGRAM WITH TOMO AND CAD
8 series · 9 of 24 positions shown · non-contrast
Comparison: Previous exam(s).

CLINICAL DATA: Screening.

EXAM:
DIGITAL SCREENING BILATERAL MAMMOGRAM WITH TOMO AND CAD

[L MLO synth-2D]
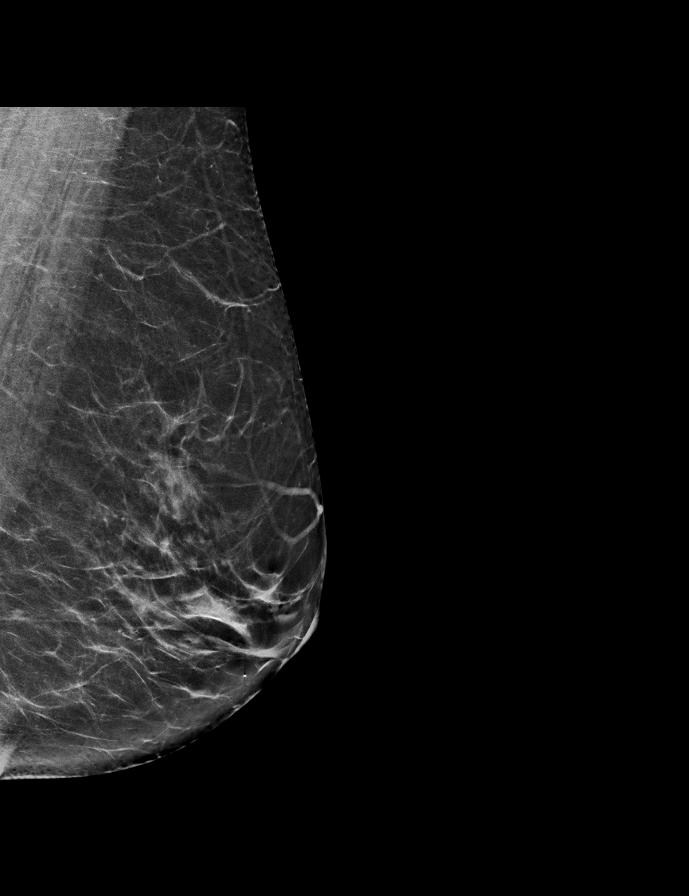

[R CC synth-2D]
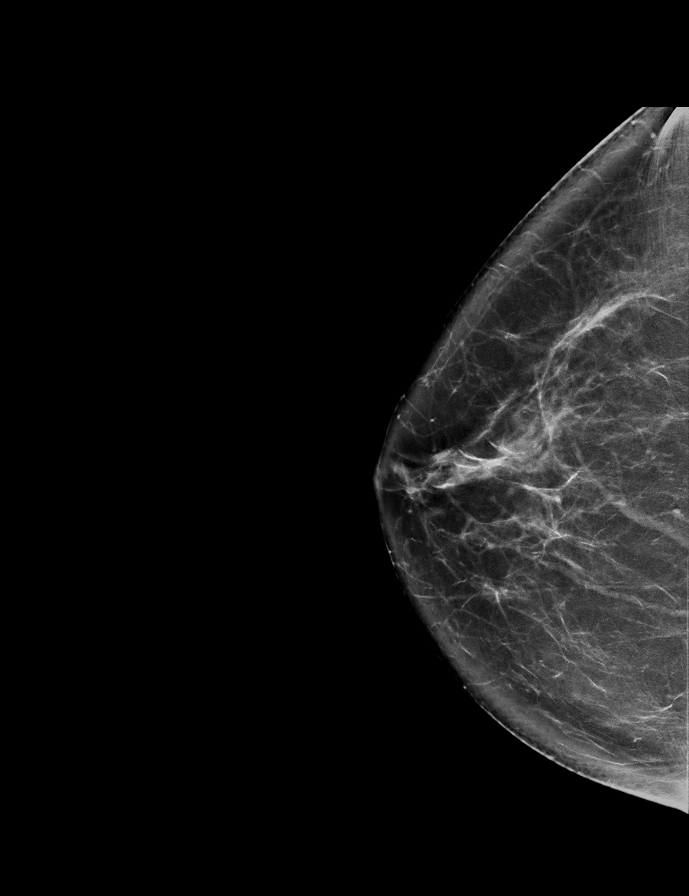

[L CC synth-2D]
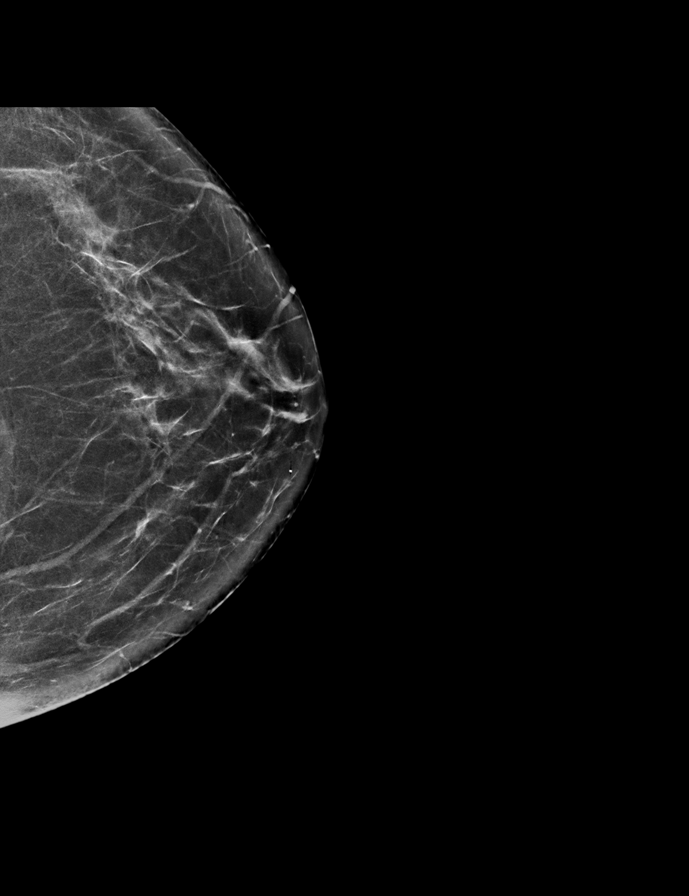

[R MLO synth-2D]
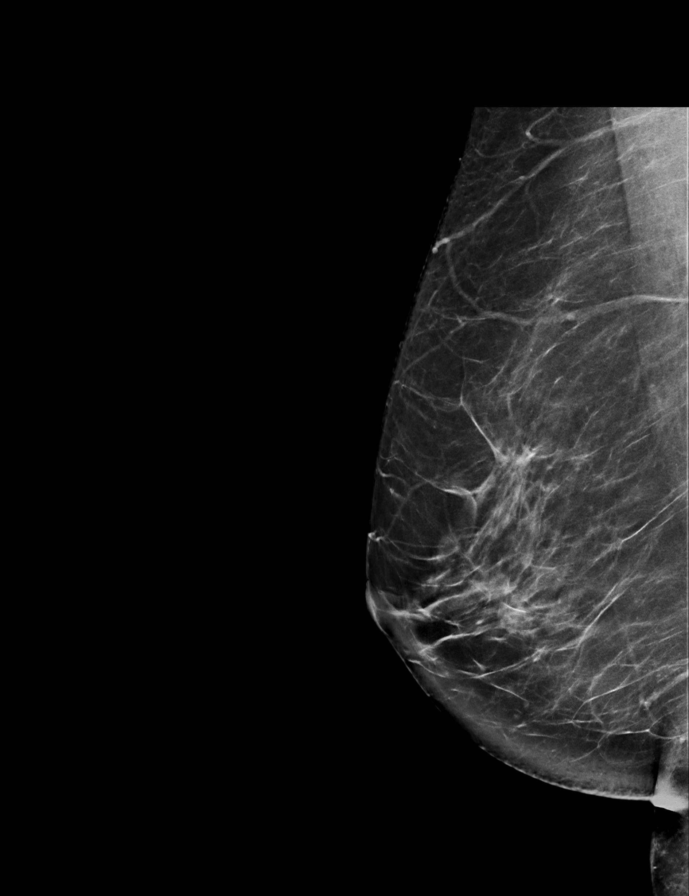

[L CC tomo · 2 of 65 frames shown]
[frame 21/65]
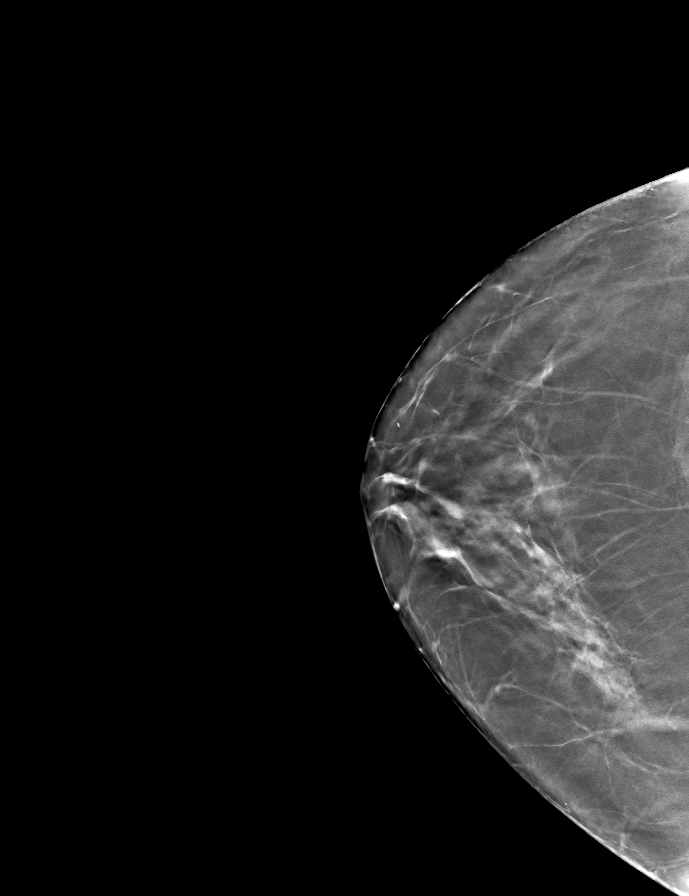
[frame 33/65]
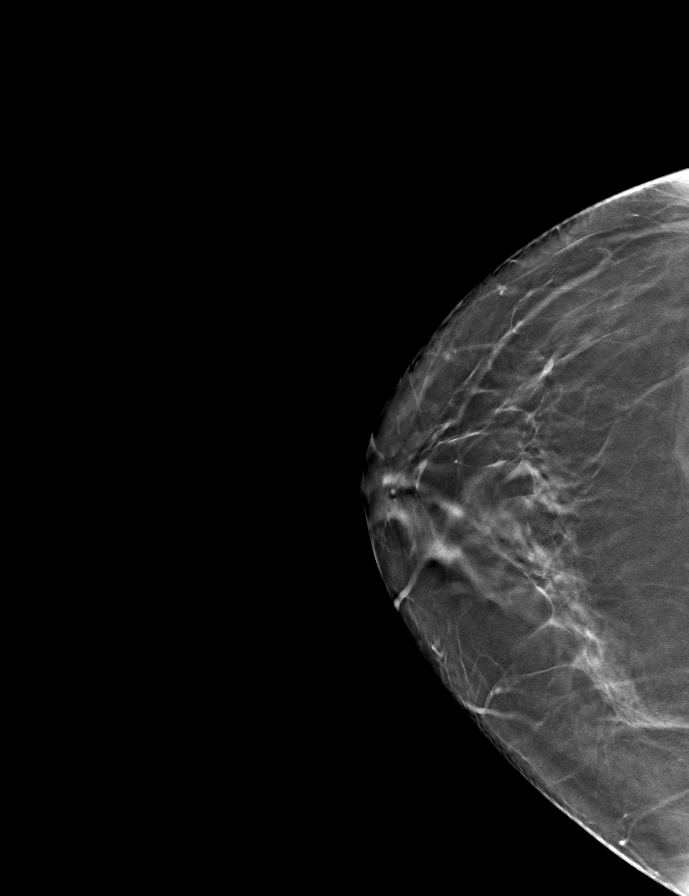

[R MLO tomo · tomo slice 38/75.0]
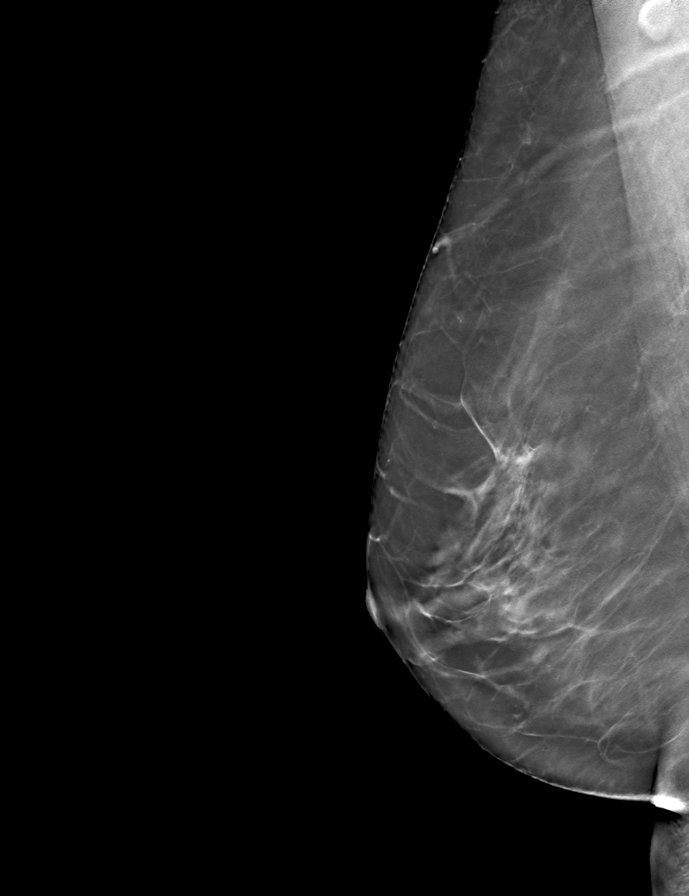

[L MLO tomo · tomo slice 33/65.0]
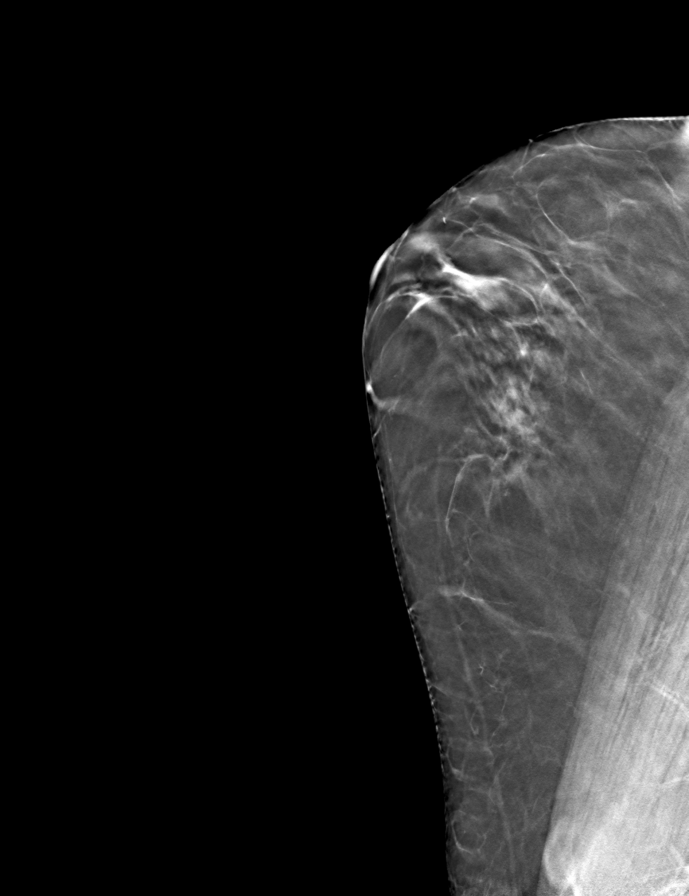

[R CC tomo · tomo slice 37/72.0]
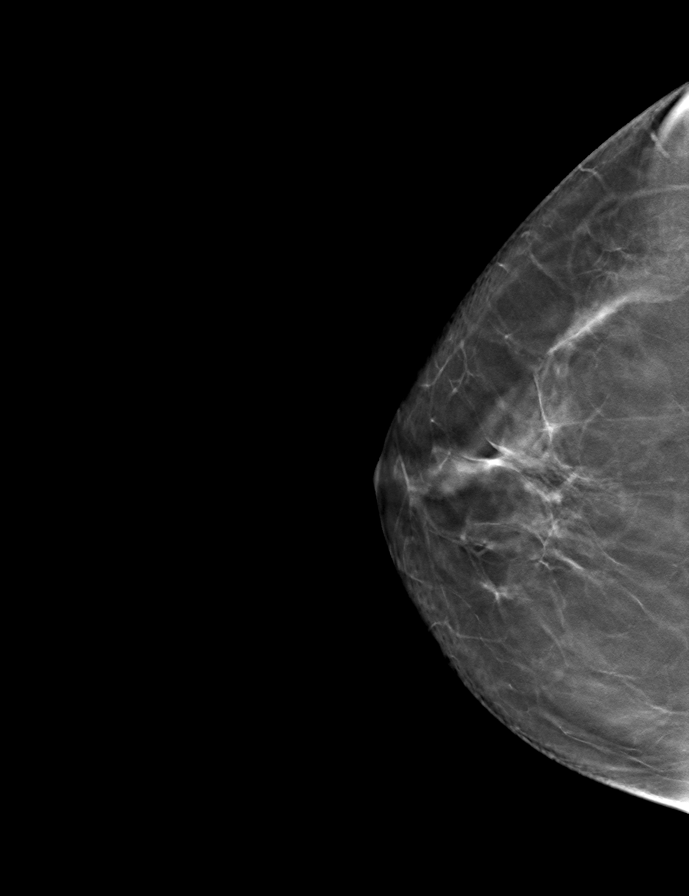

[9 of 24 positions shown; findings below may reference images not displayed]

ACR Breast Density Category b: There are scattered areas of
fibroglandular density.
FINDINGS: There are no findings suspicious for malignancy. Images were
processed with CAD.
IMPRESSION: No mammographic evidence of malignancy. A result letter of this
screening mammogram will be mailed directly to the patient.

RECOMMENDATION:
Screening mammogram in one year. (Code:[id number])

BI-RADS CATEGORY  1: Negative.

## 2019-10-11 IMAGING — CT CT ABD-PELV W/ CM
2 of 5 series · 16 of 46 positions shown, 18 images · IV contrast (APPLIED)
Comparison: 12/24/2016 and prior CTs

CLINICAL DATA: 56-year-old female with acute abdominal and pelvic
pain for 3 weeks.

EXAM:
CT ABDOMEN AND PELVIS WITH CONTRAST
TECHNIQUE: Multidetector CT imaging of the abdomen and pelvis was performed
using the standard protocol following bolus administration of
intravenous contrast.
CONTRAST:  100mL B6OHRH-[id number] IOPAMIDOL (B6OHRH-WQQ) INJECTION 61%

[Series 2: axial st · axial · 0.78mm/px · z∈[-442,-46]mm · 13 of 89 slices shown, 15 images]
[im 5/89  soft-tissue]
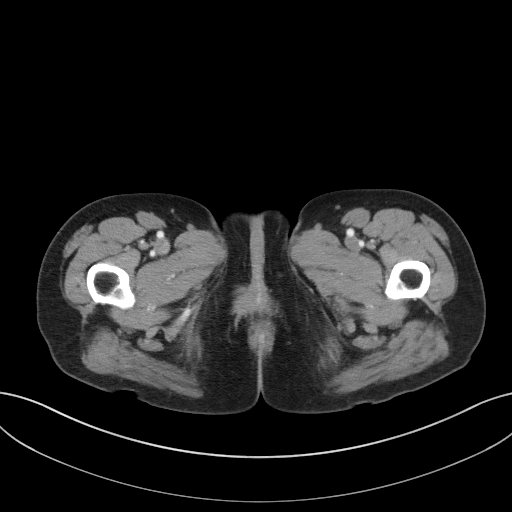
[im 5/89  bone]
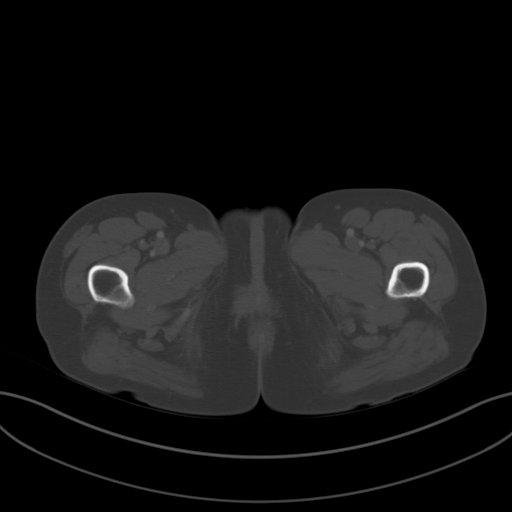
[im 14/89  soft-tissue]
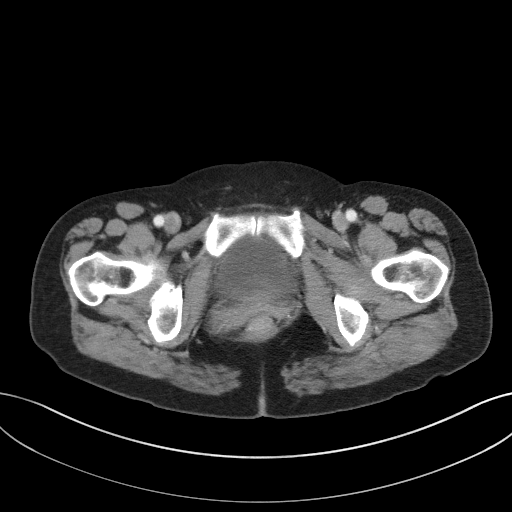
[im 18/89  soft-tissue]
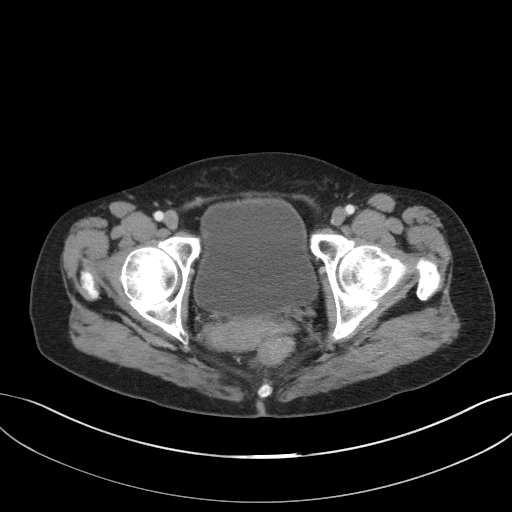
[im 27/89  soft-tissue]
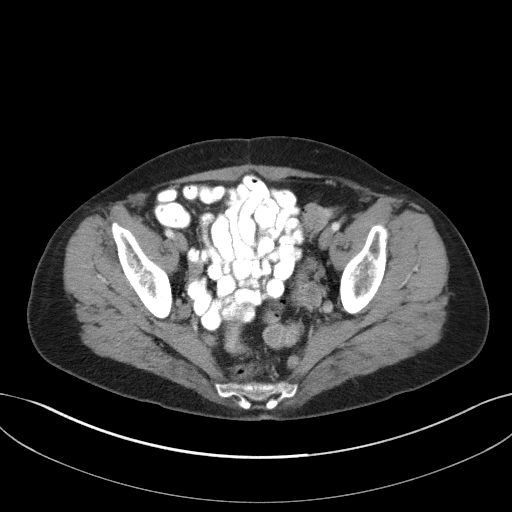
[im 31/89  soft-tissue]
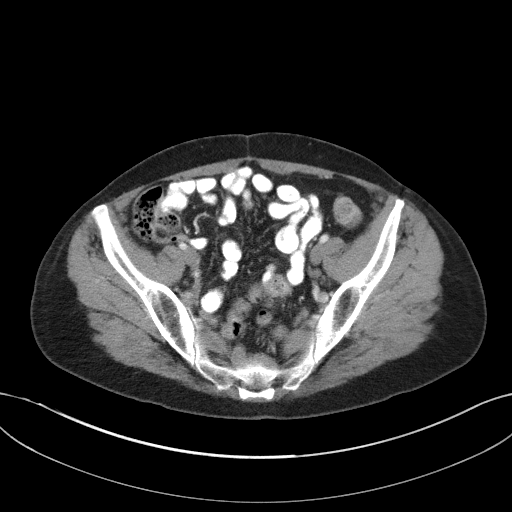
[im 40/89  soft-tissue]
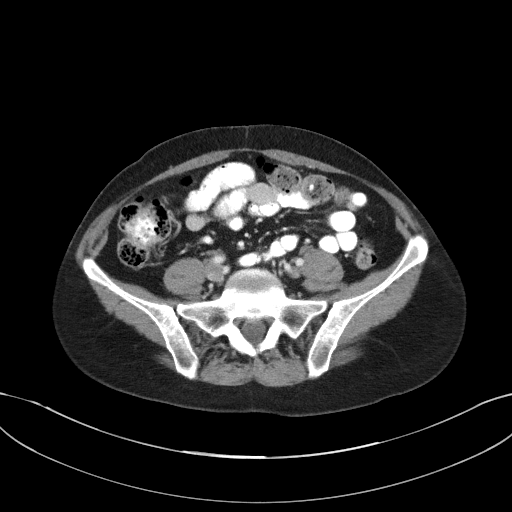
[im 45/89  soft-tissue]
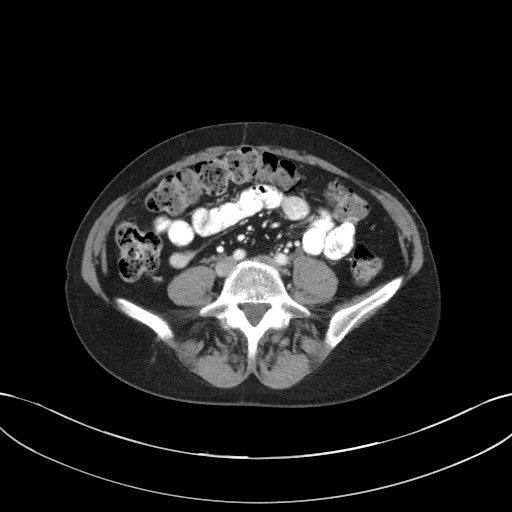
[im 49/89  soft-tissue]
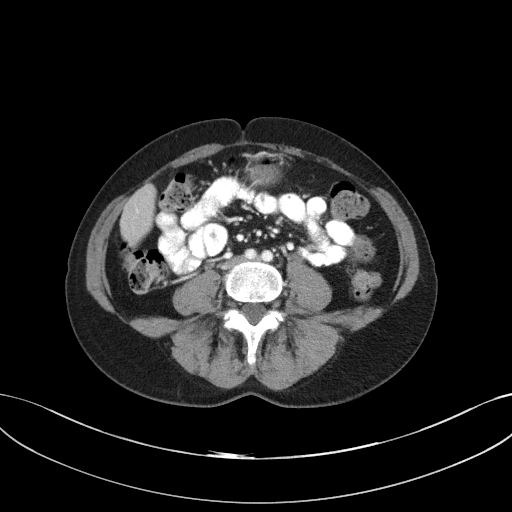
[im 58/89  soft-tissue]
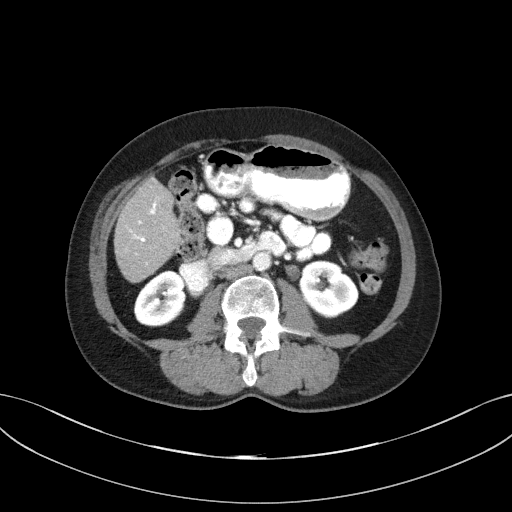
[im 58/89  bone]
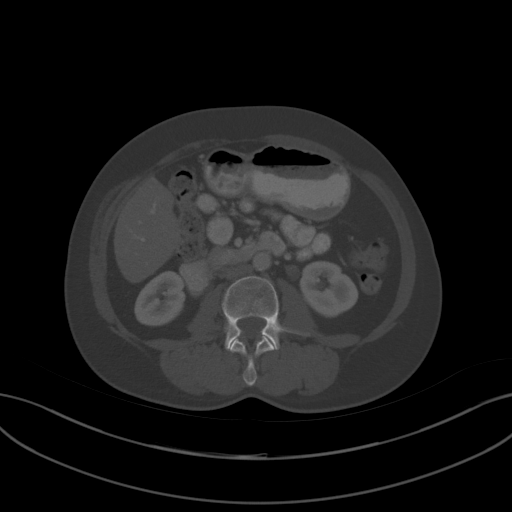
[im 62/89  soft-tissue]
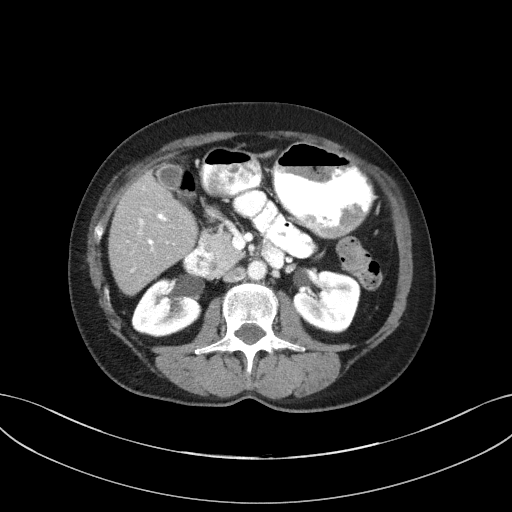
[im 71/89  soft-tissue]
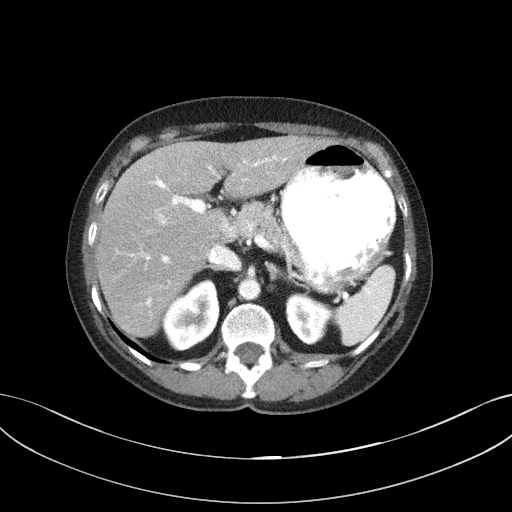
[im 75/89  soft-tissue]
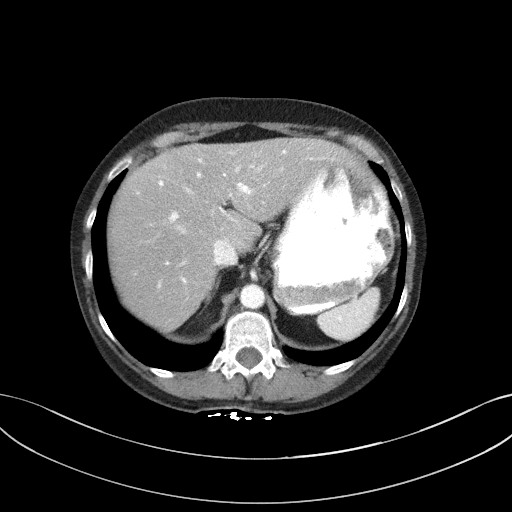
[im 84/89  soft-tissue]
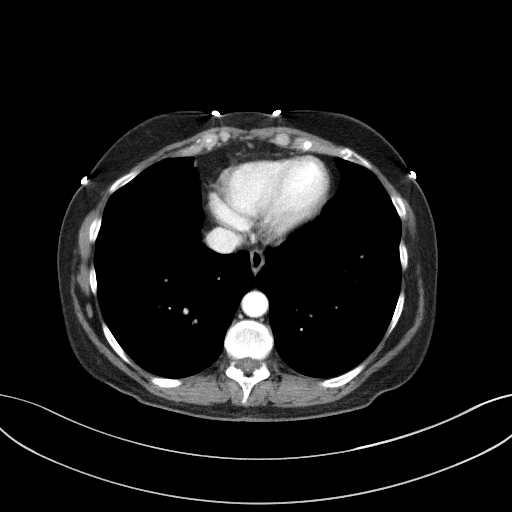

[Series 5: coronal st · coronal · 0.73mm/px · 3 of 85 slices shown]
[im 29/85  soft-tissue]
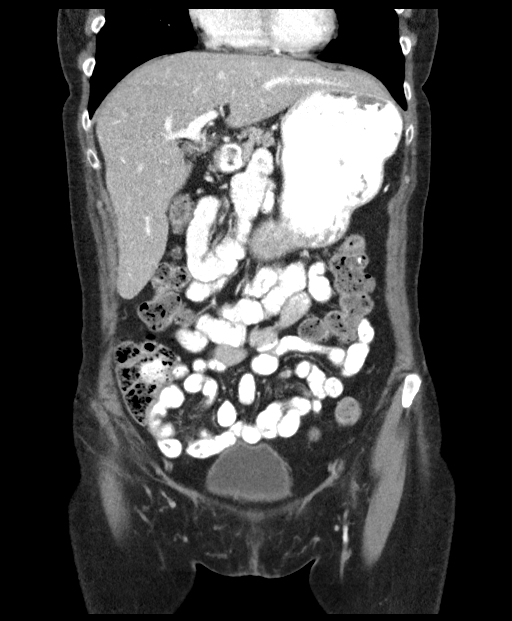
[im 38/85  soft-tissue]
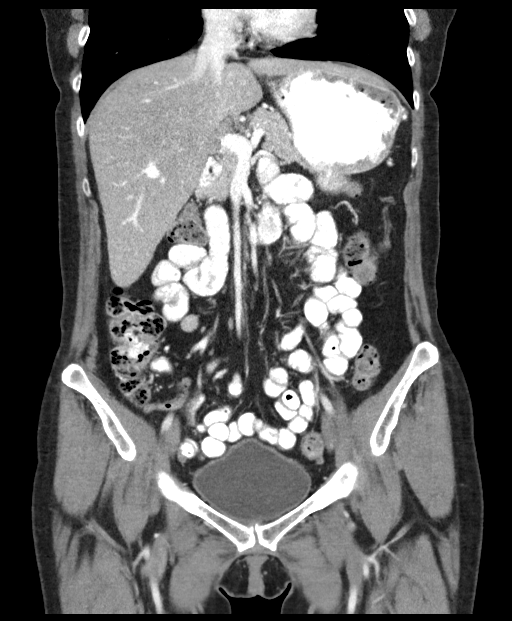
[im 47/85  soft-tissue]
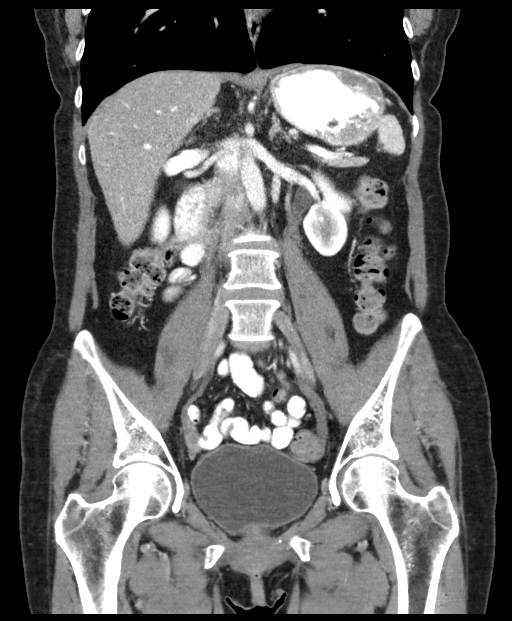

[16 of 46 positions shown; findings below may reference images not displayed]

FINDINGS: Lower chest: Unremarkable

Hepatobiliary: The liver and gallbladder are unremarkable except for
mild hepatic steatosis. No biliary dilatation..

Pancreas: Unremarkable

Spleen: Unremarkable

Adrenals/Urinary Tract: The kidneys, adrenal glands and bladder are
unremarkable.

Stomach/Bowel: Stomach is within normal limits. Appendix appears
normal. No evidence of bowel wall thickening, distention, or
inflammatory changes.

Vascular/Lymphatic: Aortic atherosclerosis. No enlarged abdominal or
pelvic lymph nodes.

Reproductive: Uterus and bilateral adnexa are unremarkable except
for tubal ligation clips..

Other: No ascites, focal collection, pneumoperitoneum or abdominal
wall hernia.

Musculoskeletal: No acute or significant osseous findings.
IMPRESSION: 1. No acute abnormality. No CT findings to suggest a cause for this
patient's abdominal pain.
2. Mild hepatic steatosis
3.  Aortic Atherosclerosis ([id number]).
